# Patient Record
Sex: Female | Born: 1955
Health system: Southern US, Community
[De-identification: ages and names within clinical notes are randomized; demographics above are authoritative.]

## PROBLEM LIST (undated history)

## (undated) DIAGNOSIS — J309 Allergic rhinitis, unspecified: Secondary | ICD-10-CM

## (undated) DIAGNOSIS — M1990 Unspecified osteoarthritis, unspecified site: Secondary | ICD-10-CM

## (undated) DIAGNOSIS — T7840XA Allergy, unspecified, initial encounter: Secondary | ICD-10-CM

## (undated) DIAGNOSIS — N189 Chronic kidney disease, unspecified: Secondary | ICD-10-CM

## (undated) DIAGNOSIS — I10 Essential (primary) hypertension: Secondary | ICD-10-CM

## (undated) HISTORY — DX: Allergic rhinitis, unspecified: J30.9

## (undated) HISTORY — DX: Unspecified osteoarthritis, unspecified site: M19.90

## (undated) HISTORY — DX: Chronic kidney disease, unspecified: N18.9

## (undated) HISTORY — PX: ABDOMINAL HYSTERECTOMY: SHX81

## (undated) HISTORY — PX: CHOLECYSTECTOMY: SHX55

## (undated) HISTORY — DX: Allergy, unspecified, initial encounter: T78.40XA

## (undated) HISTORY — PX: APPENDECTOMY: SHX54

---

## 2005-07-11 ENCOUNTER — Emergency Department (HOSPITAL_COMMUNITY): Admission: EM | Admit: 2005-07-11 | Discharge: 2005-07-11 | Payer: Self-pay | Admitting: Family Medicine

## 2005-10-29 ENCOUNTER — Emergency Department (HOSPITAL_COMMUNITY): Admission: EM | Admit: 2005-10-29 | Discharge: 2005-10-29 | Payer: Self-pay | Admitting: Family Medicine

## 2005-11-05 ENCOUNTER — Emergency Department (HOSPITAL_COMMUNITY): Admission: EM | Admit: 2005-11-05 | Discharge: 2005-11-05 | Payer: Self-pay | Admitting: Family Medicine

## 2005-11-16 ENCOUNTER — Emergency Department (HOSPITAL_COMMUNITY): Admission: AD | Admit: 2005-11-16 | Discharge: 2005-11-16 | Payer: Self-pay | Admitting: Family Medicine

## 2007-09-08 ENCOUNTER — Emergency Department (HOSPITAL_COMMUNITY): Admission: EM | Admit: 2007-09-08 | Discharge: 2007-09-08 | Payer: Self-pay | Admitting: Family Medicine

## 2009-04-24 ENCOUNTER — Emergency Department (HOSPITAL_COMMUNITY): Admission: EM | Admit: 2009-04-24 | Discharge: 2009-04-24 | Payer: Self-pay | Admitting: Family Medicine

## 2010-02-15 ENCOUNTER — Emergency Department (HOSPITAL_COMMUNITY): Admission: EM | Admit: 2010-02-15 | Discharge: 2010-02-15 | Payer: Self-pay | Admitting: Family Medicine

## 2010-10-03 ENCOUNTER — Inpatient Hospital Stay (INDEPENDENT_AMBULATORY_CARE_PROVIDER_SITE_OTHER)
Admission: RE | Admit: 2010-10-03 | Discharge: 2010-10-03 | Disposition: A | Discharge status: Home / Self Care | Payer: 59 | Source: Ambulatory Visit | Attending: Family Medicine | Admitting: Family Medicine

## 2010-10-03 DIAGNOSIS — B353 Tinea pedis: Secondary | ICD-10-CM

## 2011-07-06 ENCOUNTER — Other Ambulatory Visit (HOSPITAL_COMMUNITY)
Admission: RE | Admit: 2011-07-06 | Discharge: 2011-07-06 | Disposition: A | Discharge status: Home / Self Care | Payer: 59 | Source: Ambulatory Visit | Attending: Internal Medicine | Admitting: Internal Medicine

## 2011-07-06 ENCOUNTER — Other Ambulatory Visit (INDEPENDENT_AMBULATORY_CARE_PROVIDER_SITE_OTHER): Payer: 59

## 2011-07-06 ENCOUNTER — Ambulatory Visit (INDEPENDENT_AMBULATORY_CARE_PROVIDER_SITE_OTHER): Payer: 59 | Admitting: Internal Medicine

## 2011-07-06 ENCOUNTER — Encounter: Payer: Self-pay | Admitting: Internal Medicine

## 2011-07-06 VITALS — BP 124/84 | HR 80 | Temp 97.3°F | Resp 16 | Ht 64.0 in | Wt 131.0 lb

## 2011-07-06 DIAGNOSIS — Z0000 Encounter for general adult medical examination without abnormal findings: Secondary | ICD-10-CM

## 2011-07-06 DIAGNOSIS — F17200 Nicotine dependence, unspecified, uncomplicated: Secondary | ICD-10-CM

## 2011-07-06 DIAGNOSIS — Z1231 Encounter for screening mammogram for malignant neoplasm of breast: Secondary | ICD-10-CM

## 2011-07-06 DIAGNOSIS — Z124 Encounter for screening for malignant neoplasm of cervix: Secondary | ICD-10-CM

## 2011-07-06 DIAGNOSIS — Z01419 Encounter for gynecological examination (general) (routine) without abnormal findings: Secondary | ICD-10-CM | POA: Insufficient documentation

## 2011-07-06 DIAGNOSIS — Z720 Tobacco use: Secondary | ICD-10-CM | POA: Insufficient documentation

## 2011-07-06 DIAGNOSIS — J019 Acute sinusitis, unspecified: Secondary | ICD-10-CM

## 2011-07-06 DIAGNOSIS — Z23 Encounter for immunization: Secondary | ICD-10-CM

## 2011-07-06 DIAGNOSIS — Z800 Family history of malignant neoplasm of digestive organs: Secondary | ICD-10-CM | POA: Insufficient documentation

## 2011-07-06 LAB — URINALYSIS, ROUTINE W REFLEX MICROSCOPIC
Nitrite: NEGATIVE
Total Protein, Urine: NEGATIVE
Urine Glucose: NEGATIVE
pH: 6 (ref 5.0–8.0)

## 2011-07-06 LAB — COMPREHENSIVE METABOLIC PANEL
Albumin: 3.8 g/dL (ref 3.5–5.2)
Alkaline Phosphatase: 83 U/L (ref 39–117)
BUN: 18 mg/dL (ref 6–23)
CO2: 27 mEq/L (ref 19–32)
GFR: 74.18 mL/min (ref >60.00)
Glucose, Bld: 92 mg/dL (ref 70–99)
Total Bilirubin: 0.2 mg/dL — ABNORMAL LOW (ref 0.3–1.2)

## 2011-07-06 LAB — LIPID PANEL
Cholesterol: 185 mg/dL (ref 0–200)
LDL Cholesterol: 108 mg/dL — ABNORMAL HIGH (ref 0–99)
Triglycerides: 178 mg/dL — ABNORMAL HIGH (ref 0.0–149.0)
VLDL: 35.6 mg/dL (ref 0.0–40.0)

## 2011-07-06 LAB — CBC WITH DIFFERENTIAL/PLATELET
Basophils Relative: 0.7 % (ref 0.0–3.0)
Eosinophils Relative: 5.2 % — ABNORMAL HIGH (ref 0.0–5.0)
HCT: 37.6 % (ref 36.0–46.0)
Hemoglobin: 12.1 g/dL (ref 12.0–15.0)
Lymphs Abs: 2.7 10*3/uL (ref 0.7–4.0)
MCV: 79 fl (ref 78.0–100.0)
Monocytes Absolute: 0.5 10*3/uL (ref 0.1–1.0)
Monocytes Relative: 6.5 % (ref 3.0–12.0)
RBC: 4.76 Mil/uL (ref 3.87–5.11)
WBC: 6.9 10*3/uL (ref 4.5–10.5)

## 2011-07-06 MED ORDER — VARENICLINE TARTRATE 1 MG PO TABS: 1.0000 mg | ORAL_TABLET | Freq: Two times a day (BID) | ORAL | Status: DC

## 2011-07-06 MED ORDER — VARENICLINE TARTRATE 0.5 MG X 11 & 1 MG X 42 PO MISC: ORAL | Status: DC

## 2011-07-06 MED ORDER — CEFUROXIME AXETIL 500 MG PO TABS: 500.0000 mg | ORAL_TABLET | Freq: Two times a day (BID) | ORAL | Status: AC

## 2011-07-06 NOTE — Assessment & Plan Note (Signed)
Start ceftin for the infection 

## 2011-07-06 NOTE — Assessment & Plan Note (Signed)
Exam done, labs ordered, vaccines updated, referrals issued, pt ed material was given

## 2011-07-06 NOTE — Assessment & Plan Note (Signed)
She agrees to quit smoking and has decided to use Chantix

## 2011-07-06 NOTE — Assessment & Plan Note (Signed)
She was referred for a colonoscopy

## 2011-07-06 NOTE — Progress Notes (Signed)
Subjective:    Patient ID: Nichole Hanson, female    DOB: 19-Jun-1955, 56 y.o.   MRN: [id number]  Sinusitis This is a new problem. The current episode started in the past 7 days. The problem has been gradually worsening since onset. There has been no fever. Her pain is at a severity of 0/10. She is experiencing no pain. Associated symptoms include congestion, sinus pressure, sneezing and a sore throat. Pertinent negatives include no chills, coughing, diaphoresis, ear pain, headaches, hoarse voice, neck pain, shortness of breath or swollen glands. Past treatments include nothing.      Review of Systems  Constitutional: Negative for fever, chills, diaphoresis, activity change, appetite change, fatigue and unexpected weight change.  HENT: Positive for congestion, sore throat, rhinorrhea, sneezing, postnasal drip and sinus pressure. Negative for hearing loss, ear pain, nosebleeds, hoarse voice, facial swelling, drooling, mouth sores, trouble swallowing, neck pain, neck stiffness, dental problem, voice change, tinnitus and ear discharge.   Eyes: Negative.   Respiratory: Negative for apnea, cough, choking, chest tightness, shortness of breath, wheezing and stridor.   Cardiovascular: Negative for chest pain, palpitations and leg swelling.  Gastrointestinal: Negative for nausea, vomiting, abdominal pain, diarrhea, constipation, blood in stool, abdominal distention, anal bleeding and rectal pain.  Genitourinary: Negative for dysuria, urgency, frequency, hematuria, flank pain, decreased urine volume, vaginal bleeding, vaginal discharge, enuresis, difficulty urinating, genital sores, vaginal pain, menstrual problem, pelvic pain and dyspareunia.  Musculoskeletal: Positive for arthralgias (knees and shoulders). Negative for myalgias, back pain, joint swelling and gait problem.  Skin: Negative for color change, pallor, rash and wound.  Neurological: Negative for dizziness, tremors, seizures, syncope, facial  asymmetry, speech difficulty, weakness, light-headedness, numbness and headaches.  Hematological: Negative for adenopathy. Does not bruise/bleed easily.  Psychiatric/Behavioral: Negative.        Objective:   Physical Exam  Vitals reviewed. Constitutional: She is oriented to person, place, and time. She appears well-developed and well-nourished. No distress.  HENT:  Head: No trismus in the jaw.  Right Ear: Hearing, tympanic membrane, external ear and ear canal normal.  Left Ear: Hearing, external ear and ear canal normal.  Nose: Mucosal edema and rhinorrhea present. No nose lacerations, sinus tenderness, nasal deformity, septal deviation or nasal septal hematoma. No epistaxis.  No foreign bodies. Right sinus exhibits maxillary sinus tenderness. Right sinus exhibits no frontal sinus tenderness. Left sinus exhibits maxillary sinus tenderness. Left sinus exhibits no frontal sinus tenderness.  Mouth/Throat: Uvula is midline, oropharynx is clear and moist and mucous membranes are normal. Mucous membranes are not pale, not dry and not cyanotic. No uvula swelling. No oropharyngeal exudate, posterior oropharyngeal edema, posterior oropharyngeal erythema or tonsillar abscesses.  Eyes: Conjunctivae are normal. Right eye exhibits no discharge. Left eye exhibits no discharge. No scleral icterus.  Neck: Normal range of motion. Neck supple. No JVD present. No tracheal deviation present. No thyromegaly present.  Cardiovascular: Normal rate, regular rhythm, normal heart sounds and intact distal pulses.  Exam reveals no gallop and no friction rub.   No murmur heard. Pulmonary/Chest: Breath sounds normal. No accessory muscle usage or stridor. Not tachypneic. No respiratory distress. She has no decreased breath sounds. She has no wheezes. She has no rhonchi. She has no rales. Chest wall is not dull to percussion. She exhibits no mass, no tenderness, no bony tenderness, no laceration, no crepitus, no edema, no  deformity, no swelling and no retraction. Right breast exhibits no inverted nipple, no mass, no nipple discharge, no skin change and no tenderness. Left  breast exhibits no inverted nipple, no mass, no nipple discharge, no skin change and no tenderness. Breasts are symmetrical.  Abdominal: Soft. Bowel sounds are normal. She exhibits no distension and no mass. There is no tenderness. There is no rebound and no guarding. Hernia confirmed negative in the right inguinal area and confirmed negative in the left inguinal area.  Genitourinary: Vagina normal. No breast swelling, tenderness, discharge or bleeding. No labial fusion. There is no rash, tenderness, lesion or injury on the right labia. There is no rash, tenderness, lesion or injury on the left labia. No erythema, tenderness or bleeding around the vagina. No foreign body around the vagina. No signs of injury around the vagina. No vaginal discharge found.  Musculoskeletal: Normal range of motion. She exhibits no edema and no tenderness.  Lymphadenopathy:    She has no cervical adenopathy.       Right: No inguinal adenopathy present.       Left: No inguinal adenopathy present.  Neurological: She is oriented to person, place, and time.  Skin: Skin is warm and dry. No rash noted. She is not diaphoretic. No erythema. No pallor.  Psychiatric: She has a normal mood and affect. Her behavior is normal. Judgment and thought content normal.          Assessment & Plan:

## 2011-07-06 NOTE — Assessment & Plan Note (Signed)
ordered

## 2011-07-06 NOTE — Patient Instructions (Signed)
Preventative Care for Adults, Female A healthy lifestyle and preventative care can promote health and wellness. Preventative health guidelines for women include the following key practices:  A routine yearly physical is a good way to check with your caregiver about your health and preventative screening. It is a chance to share any concerns and updates on your health, and to receive a thorough exam.   Visit your dentist for a routine exam and preventative care every 6 months. Brush your teeth twice a day and floss once a day. Good oral hygiene prevents tooth decay and gum disease.   The frequency of eye exams is based on your age, health, family medical history, use of contact lenses, and other factors. Follow your caregiver's recommendations for frequency of eye exams.   Eat a healthy diet. Foods like vegetables, fruits, whole grains, low-fat dairy products, and lean protein foods contain the nutrients you need without too many calories. Decrease your intake of foods high in solid fats, added sugars, and salt. Eat the right amount of calories for you.Get information about a proper diet from your caregiver, if necessary.   Regular physical exercise is one of the most important things you can do for your health. Most adults should get at least 150 minutes of moderate-intensity exercise (any activity that increases your heart rate and causes you to sweat) each week. In addition, most adults need muscle-strengthening exercises on 2 or more days a week.   Maintain a healthy weight. The body mass index (BMI) is a screening tool to identify possible weight problems. It provides an estimate of body fat based on height and weight. Your caregiver can help determine your BMI, and can help you achieve or maintain a healthy weight.For adults 20 years and older:   A BMI below 18.5 is considered underweight.   A BMI of 18.5 to 24.9 is normal.   A BMI of 25 to 29.9 is considered overweight.   A BMI of 30 and  above is considered obese.   Maintain normal blood lipids and cholesterol levels by exercising and minimizing your intake of saturated fat. Eat a balanced diet with plenty of fruit and vegetables. Blood tests for lipids and cholesterol should begin at age 20 and be repeated every 5 years. If your lipid or cholesterol levels are high, you are over 50, or you are a high risk for heart disease, you may need your cholesterol levels checked more frequently.Ongoing high lipid and cholesterol levels should be treated with medicines if diet and exercise are not effective.   If you smoke, find out from your caregiver how to quit. If you do not use tobacco, do not start.   If you are pregnant, do not drink alcohol. If you are breastfeeding, be very cautious about drinking alcohol. If you are not pregnant and choose to drink alcohol, do not exceed 1 drink per day. One drink is considered to be 12 ounces (355 mL) of beer, 5 ounces (148 mL) of wine, or 1.5 ounces (44 mL) of liquor.   Avoid use of street drugs. Do not share needles with anyone. Ask for help if you need support or instructions about stopping the use of drugs.   High blood pressure causes heart disease and increases the risk of stroke. Your blood pressure should be checked at least every 1 to 2 years. Ongoing high blood pressure should be treated with medicines if weight loss and exercise are not effective.   If you are 55 to 58   years old, ask your caregiver if you should take aspirin to prevent strokes.   Diabetes screening involves taking a blood sample to check your fasting blood sugar level. This should be done once every 3 years, after age 45, if you are within normal weight and without risk factors for diabetes. Testing should be considered at a younger age or be carried out more frequently if you are overweight and have at least 1 risk factor for diabetes.   Breast cancer screening is essential preventative care for women. You should  practice "breast self-awareness." This means understanding the normal appearance and feel of your breasts and may include breast self-examination. Any changes detected, no matter how small, should be reported to a caregiver. Women in their 20s and 30s should have a clinical breast exam (CBE) by a caregiver as part of a regular health exam every 1 to 3 years. After age 40, women should have a CBE every year. Starting at age 40, women should consider having a mammogram (breast X-ray) every year. Women who have a family history of breast cancer should talk to their caregiver about genetic screening. Women at a high risk of breast cancer should talk to their caregiver about having an MRI and a mammogram every year.   The Pap test is a screening test for cervical cancer. A Pap test can show cell changes on the cervix that might become cervical cancer if left untreated. A Pap test is a procedure in which cells are obtained and examined from the lower end of the uterus (cervix).   Women should have a Pap test starting at age 21.   Between ages 21 and 29, Pap tests should be repeated every 2 years.   Beginning at age 30, you should have a Pap test every 3 years as long as the past 3 Pap tests have been normal.   Some women have medical problems that increase the chance of getting cervical cancer. Talk to your caregiver about these problems. It is especially important to talk to your caregiver if a new problem develops soon after your last Pap test. In these cases, your caregiver may recommend more frequent screening and Pap tests.   The above recommendations are the same for women who have or have not gotten the vaccine for human papillomavirus (HPV).   If you had a hysterectomy for a problem that was not cancer or a condition that could lead to cancer, then you no longer need Pap tests. Even if you no longer need a Pap test, a regular exam is a good idea to make sure no other problems are starting.   If you  are between ages 65 and 70, and you have had normal Pap tests going back 10 years, you no longer need Pap tests. Even if you no longer need a Pap test, a regular exam is a good idea to make sure no other problems are starting.   If you have had past treatment for cervical cancer or a condition that could lead to cancer, you need Pap tests and screening for cancer for at least 20 years after your treatment.   If Pap tests have been discontinued, risk factors (such as a new sexual partner) need to be reassessed to determine if screening should be resumed.   The HPV test is an additional test that may be used for cervical cancer screening. The HPV test looks for the virus that can cause the cell changes on the cervix. The cells collected   during the Pap test can be tested for HPV. The HPV test could be used to screen women aged 30 years and older, and should be used in women of any age who have unclear Pap test results. After the age of 30, women should have HPV testing at the same frequency as a Pap test.   Colorectal cancer can be detected and often prevented. Most routine colorectal cancer screening begins at the age of 50 and continues through age 75. However, your caregiver may recommend screening at an earlier age if you have risk factors for colon cancer. On a yearly basis, your caregiver may provide home test kits to check for hidden blood in the stool. Use of a small camera at the end of a tube, to directly examine the colon (sigmoidoscopy or colonoscopy), can detect the earliest forms of colorectal cancer. Talk to your caregiver about this at age 50, when routine screening begins. Direct examination of the colon should be repeated every 5 to 10 years through age 75, unless early forms of pre-cancerous polyps or small growths are found.   Practice safe sex. Use condoms and avoid high-risk sexual practices to reduce the spread of sexually transmitted infections (STIs). STIs include gonorrhea,  chlamydia, syphilis, trichomonas, herpes, HPV, and human immunodeficiency virus (HIV). Herpes, HIV, and HPV are viral illnesses that have no cure. They can result in disability, cancer, and death. Sexually active women aged 25 and younger should be checked for Chlamydia. Older women with new or multiple partners should also be tested for Chlamydia. Testing for other STIs is recommended if you are sexually active and at increased risk.   Osteoporosis is a disease in which the bones lose minerals and strength with aging. This can result in serious bone fractures. The risk of osteoporosis can be identified using a bone density scan. Women ages 65 and over and women at risk for fractures or osteoporosis should discuss screening with their caregivers. Ask your caregiver whether you should take a calcium supplement or vitamin D to reduce the rate of osteoporosis.   Menopause can be associated with physical symptoms and risks. Hormone replacement therapy is available to decrease symptoms and risks. You should talk to your caregiver about whether hormone replacement therapy is right for you.   Use sunscreen with skin protection factor (SPF) of 30 or more. Apply sunscreen liberally and repeatedly throughout the day. You should seek shade when your shadow is shorter than you. Protect yourself by wearing long sleeves, pants, a wide-brimmed hat, and sunglasses year round, whenever you are outdoors.   Once a month, do a whole body skin exam, using a mirror to look at the skin on your back. Notify your caregiver of new moles, moles that have irregular borders, moles that are larger than a pencil eraser, or moles that have changed in shape or color.   Stay current with required immunizations.   Influenza. You need a dose every fall (or winter). The composition of the flu vaccine changes each year, so being vaccinated once is not enough.   Pneumococcal polysaccharide. You need 1 to 2 doses if you smoke cigarettes or  if you have certain chronic medical conditions. You need 1 dose at age 65 (or older) if you have never been vaccinated.   Tetanus, diphtheria, pertussis (Tdap, Td). Get 1 dose of Tdap vaccine if you are younger than age 65 years, are over 65 and have contact with an infant, are a healthcare worker, are pregnant, or simply want   to be protected from whooping cough. After that, you need a Td booster dose every 10 years. Consult your caregiver if you have not had at least 3 tetanus and diphtheria-containing shots sometime in your life or have a deep or dirty wound.   HPV. You need this vaccine if you are a woman age 26 years or younger. The vaccine is given in 3 doses over 6 months.   Measles, mumps, rubella (MMR). You need at least 1 dose of MMR if you were born in 1957 or later. You may also need a 2nd dose.   Meningococcal. If you are age 19 to 21 years and a first-year college student living in a residence hall, or have one of several medical conditions, you need to get vaccinated against meningococcal disease. You may also need additional booster doses.   Zoster (shingles). If you are age 60 years or older, you should get this vaccine.   Varicella (chickenpox). If you have never had chickenpox or you were vaccinated but received only 1 dose, talk to your caregiver to find out if you need this vaccine.   Hepatitis A. You need this vaccine if you have a specific risk factor for hepatitis A virus infection or you simply wish to be protected from this disease. The vaccine is usually given as 2 doses, 6 to 18 months apart.   Hepatitis B. You need this vaccine if you have a specific risk factor for hepatitis B virus infection or you simply wish to be protected from this disease. The vaccine is given in 3 doses, usually over 6 months.  Preventative Services / Frequency Ages 19 to 39  Blood pressure check.** / Every 1 to 2 years.   Lipid and cholesterol check.**/ Every 5 years beginning at age 20.    Clinical breast exam.** / Every 3 years for women in their 20s and 30s.   Pap Test.** / Every 2 years from ages 21 through 29. Every 3 years starting at age 30 years through age 65 or 70 with a history of 3 consecutive normal Pap tests.   HPV Screening.** / Every 3 years from ages 30 through ages 65 to 70 with a history of 3 consecutive normal Pap tests.   Skin self-exam. / Monthly.   Influenza immunization.** / Every year.   Pneumococcal polysaccharide immunization.** / 1 to 2 doses if you smoke cigarettes or if you have certain chronic medical conditions.   Tetanus, diphtheria, pertussis (Tdap,Td) immunization. / A one-time dose of Tdap vaccine. After that, you need a Td booster dose every 10 years.   HPV immunization. / 3 doses over 6 months, if 26 and younger.   Measles, mumps, rubella (MMR) immunization. / You need at least 1 dose of MMR if you were born in 1957 or later. You may also need a 2nd dose.   Meningococcal immunization. / 1 dose if you are age 19 to 21 years and a first-year college student living in a residence hall, or have one of several medical conditions, you need to get vaccinated against meningococcal disease. You may also need additional booster doses.   Varicella immunization. **/ Consult your caregiver.   Hepatitis A immunization. ** / Consult your caregiver. 2 doses, 6 to 18 months apart.   Hepatitis B immunization.** / Consult your caregiver. 3 doses usually over 6 months.  Ages 40 to 64  Blood pressure check.** / Every 1 to 2 years.   Lipid and cholesterol check.**/ Every 5 years beginning   at age 20.   Clinical breast exam.** / Every year after age 40.   Mammogram.** / Every year beginning at age 40 and continuing for as long as you are in good health. Consult with your caregiver.   Pap Test.** / Every 3 years starting at age 30 years through age 65 or 70 with a history of 3 consecutive normal Pap tests.   HPV Screening.** / Every 3 years from  ages 30 through ages 65 to 70 with a history of 3 consecutive normal Pap tests.   Fecal occult blood test (FOBT) of stool. / Every year beginning at age 50 and continuing until age 75. You may not have to do this test if you get colonoscopy every 10 years.   Flexible sigmoidoscopy** or colonoscopy.** / Every 5 years for a flexible sigmoidoscopy or every 10 years for a colonoscopy beginning at age 50 and continuing until age 75.   Skin self-exam. / Monthly.   Influenza immunization.** / Every year.   Pneumococcal polysaccharide immunization.** / 1 to 2 doses if you smoke cigarettes or if you have certain chronic medical conditions.   Tetanus, diphtheria, pertussis (Tdap/Td) immunization.** / A one-time dose of Tdap vaccine. After that, you need a Td booster dose every 10 years.   Measles, mumps, rubella (MMR) immunization. / You need at least 1 dose of MMR if you were born in 1957 or later. You may also need a 2nd dose.   Varicella immunization. **/ Consult your caregiver.   Meningococcal immunization.** / Consult your caregiver.     Hepatitis A immunization. ** / Consult your caregiver. 2 doses, 6 to 18 months apart.   Hepatitis B immunization.** / Consult your caregiver. 3 doses, usually over 6 months.  Ages 65 and over  Blood pressure check.** / Every 1 to 2 years.   Lipid and cholesterol check.**/ Every 5 years beginning at age 20.   Clinical breast exam.** / Every year after age 40.   Mammogram.** / Every year beginning at age 40 and continuing for as long as you are in good health. Consult with your caregiver.   Pap Test,** / Every 3 years starting at age 30 years through age 65 or 70 with a 3 consecutive normal Pap tests. Testing can be stopped between 65 and 70 with 3 consecutive normal Pap tests and no abnormal Pap or HPV tests in the past 10 years.   HPV Screening.** / Every 3 years from ages 30 through ages 65 or 70 with a history of 3 consecutive normal Pap tests.  Testing can be stopped between 65 and 70 with 3 consecutive normal Pap tests and no abnormal Pap or HPV tests in the past 10 years.   Fecal occult blood test (FOBT) of stool. / Every year beginning at age 50 and continuing until age 75. You may not have to do this test if you get colonoscopy every 10 years.   Flexible sigmoidoscopy** or colonoscopy.** / Every 5 years for a flexible sigmoidoscopy or every 10 years for a colonoscopy beginning at age 50 and continuing until age 75.   Osteoporosis screening.** / A one-time screening for women ages 65 and over and women at risk for fractures or osteoporosis.   Skin self-exam. / Monthly.   Influenza immunization.** / Every year.   Pneumococcal polysaccharide immunization.** / 1 dose at age 65 (or older) if you have never been vaccinated.   Tetanus, diphtheria, pertussis (Tdap, Td) immunization. / A one-time dose of Tdap   vaccine if you are over 65 and have contact with an infant, are a healthcare worker, or simply want to be protected from whooping cough. After that, you need a Td booster dose every 10 years.   Varicella immunization. **/ Consult your caregiver.   Meningococcal immunization.** / Consult your caregiver.   Hepatitis A immunization. ** / Consult your caregiver. 2 doses, 6 to 18 months apart.   Hepatitis B immunization.** / Check with your caregiver. 3 doses, usually over 6 months.  ** Family history and personal history of risk and conditions may change your caregiver's recommendations. Document Released: 06/20/2001 Document Revised: 01/04/2011 Document Reviewed: 09/19/2010 [employer] Patient Information 2012 [employer]  Smoking Cessation This document explains the best ways for you to quit smoking and new treatments to help. It lists new medicines that can double or triple your chances of quitting and quitting for good. It also considers ways to avoid relapses and concerns you may have about quitting, including weight  gain. NICOTINE: A POWERFUL ADDICTION If you have tried to quit smoking, you know how hard it can be. It is hard because nicotine is a very addictive drug. For some people, it can be as addictive as heroin or cocaine. Usually, people make 2 or 3 tries, or more, before finally being able to quit. Each time you try to quit, you can learn about what helps and what hurts. Quitting takes hard work and a lot of effort, but you can quit smoking. QUITTING SMOKING IS ONE OF THE MOST IMPORTANT THINGS YOU WILL EVER DO.  You will live longer, feel better, and live better.   The impact on your body of quitting smoking is felt almost immediately:   Within 20 minutes, blood pressure decreases. Pulse returns to its normal level.   After 8 hours, carbon monoxide levels in the blood return to normal. Oxygen level increases.   After 24 hours, chance of heart attack starts to decrease. Breath, hair, and body stop smelling like smoke.   After 48 hours, damaged nerve endings begin to recover. Sense of taste and smell improve.   After 72 hours, the body is virtually free of nicotine. Bronchial tubes relax and breathing becomes easier.   After 2 to 12 weeks, lungs can hold more air. Exercise becomes easier and circulation improves.   Quitting will reduce your risk of having a heart attack, stroke, cancer, or lung disease:   After 1 year, the risk of coronary heart disease is cut in half.   After 5 years, the risk of stroke falls to the same as a nonsmoker.   After 10 years, the risk of lung cancer is cut in half and the risk of other cancers decreases significantly.   After 15 years, the risk of coronary heart disease drops, usually to the level of a nonsmoker.   If you are pregnant, quitting smoking will improve your chances of having a healthy baby.   The people you live with, especially your children, will be healthier.   You will have extra money to spend on things other than cigarettes.  FIVE KEYS TO  QUITTING Studies have shown that these 5 steps will help you quit smoking and quit for good. You have the best chances of quitting if you use them together: 1. Get ready.  2. Get support and encouragement.  3. Learn new skills and behaviors.  4. Get medicine to reduce your nicotine addiction and use it correctly.  5. Be prepared for relapse or difficult   situations. Be determined to continue trying to quit, even if you do not succeed at first.  1. GET READY  Set a quit date.   Change your environment.   Get rid of ALL cigarettes, ashtrays, matches, and lighters in your home, car, and place of work.   Do not let people smoke in your home.   Review your past attempts to quit. Think about what worked and what did not.   Once you quit, do not smoke. NOT EVEN A PUFF!  2. GET SUPPORT AND ENCOURAGEMENT Studies have shown that you have a better chance of being successful if you have help. You can get support in many ways.  Tell your family, friends, and coworkers that you are going to quit and need their support. Ask them not to smoke around you.   Talk to your caregivers (doctor, dentist, nurse, pharmacist, psychologist, and/or smoking counselor).   Get individual, group, or telephone counseling and support. The more counseling you have, the better your chances are of quitting. Programs are available at local hospitals and health centers. Call your local health department for information about programs in your area.   Spiritual beliefs and practices may help some smokers quit.   Quit meters are small computer programs online or downloadable that keep track of quit statistics, such as amount of "quit-time," cigarettes not smoked, and money saved.   Many smokers find one or more of the many self-help books available useful in helping them quit and stay off tobacco.  3. LEARN NEW SKILLS AND BEHAVIORS  Try to distract yourself from urges to smoke. Talk to someone, go for a walk, or occupy  your time with a task.   When you first try to quit, change your routine. Take a different route to work. Drink tea instead of coffee. Eat breakfast in a different place.   Do something to reduce your stress. Take a hot bath, exercise, or read a book.   Plan something enjoyable to do every day. Reward yourself for not smoking.   Explore interactive web-based programs that specialize in helping you quit.  4. GET MEDICINE AND USE IT CORRECTLY Medicines can help you stop smoking and decrease the urge to smoke. Combining medicine with the above behavioral methods and support can quadruple your chances of successfully quitting smoking. The [place] Food and Drug Administration (FDA) has approved 7 medicines to help you quit smoking. These medicines fall into 3 categories.  Nicotine replacement therapy (delivers nicotine to your body without the negative effects and risks of smoking):   Nicotine gum: Available over-the-counter.   Nicotine lozenges: Available over-the-counter.   Nicotine inhaler: Available by prescription.   Nicotine nasal spray: Available by prescription.   Nicotine skin patches (transdermal): Available by prescription and over-the-counter.   Antidepressant medicine (helps people abstain from smoking, but how this works is unknown):   Bupropion sustained-release (SR) tablets: Available by prescription.   Nicotinic receptor partial agonist (simulates the effect of nicotine in your brain):   Varenicline tartrate tablets: Available by prescription.   Ask your caregiver for advice about which medicines to use and how to use them. Carefully read the information on the package.   Everyone who is trying to quit may benefit from using a medicine. If you are pregnant or trying to become pregnant, nursing an infant, you are under age 18, or you smoke fewer than 10 cigarettes per day, talk to your caregiver before taking any nicotine replacement medicines.   You should stop   using a  nicotine replacement product and call your caregiver if you experience nausea, dizziness, weakness, vomiting, fast or irregular heartbeat, mouth problems with the lozenge or gum, or redness or swelling of the skin around the patch that does not go away.   Do not use any other product containing nicotine while using a nicotine replacement product.   Talk to your caregiver before using these products if you have diabetes, heart disease, asthma, stomach ulcers, you had a recent heart attack, you have high blood pressure that is not controlled with medicine, a history of irregular heartbeat, or you have been prescribed medicine to help you quit smoking.  5. BE PREPARED FOR RELAPSE OR DIFFICULT SITUATIONS  Most relapses occur within the first 3 months after quitting. Do not be discouraged if you start smoking again. Remember, most people try several times before they finally quit.   You may have symptoms of withdrawal because your body is used to nicotine. You may crave cigarettes, be irritable, feel very hungry, cough often, get headaches, or have difficulty concentrating.   The withdrawal symptoms are only temporary. They are strongest when you first quit, but they will go away within 10 to 14 days.  Here are some difficult situations to watch for:  Alcohol. Avoid drinking alcohol. Drinking lowers your chances of successfully quitting.   Caffeine. Try to reduce the amount of caffeine you consume. It also lowers your chances of successfully quitting.   Other smokers. Being around smoking can make you want to smoke. Avoid smokers.   Weight gain. Many smokers will gain weight when they quit, usually less than 10 pounds. Eat a healthy diet and stay active. Do not let weight gain distract you from your main goal, quitting smoking. Some medicines that help you quit smoking may also help delay weight gain. You can always lose the weight gained after you quit.   Bad mood or depression. There are a lot of  ways to improve your mood other than smoking.  If you are having problems with any of these situations, talk to your caregiver. SPECIAL SITUATIONS AND CONDITIONS Studies suggest that everyone can quit smoking. Your situation or condition can give you a special reason to quit.  Pregnant women/new mothers: By quitting, you protect your baby's health and your own.   Hospitalized patients: By quitting, you reduce health problems and help healing.   Heart attack patients: By quitting, you reduce your risk of a second heart attack.   Lung, head, and neck cancer patients: By quitting, you reduce your chance of a second cancer.   Parents of children and adolescents: By quitting, you protect your children from illnesses caused by secondhand smoke.  QUESTIONS TO THINK ABOUT Think about the following questions before you try to stop smoking. You may want to talk about your answers with your caregiver.  Why do you want to quit?   If you tried to quit in the past, what helped and what did not?   What will be the most difficult situations for you after you quit? How will you plan to handle them?   Who can help you through the tough times? Your family? Friends? Caregiver?   What pleasures do you get from smoking? What ways can you still get pleasure if you quit?  Here are some questions to ask your caregiver:  How can you help me to be successful at quitting?   What medicine do you think would be best for me and how should   I take it?   What should I do if I need more help?   What is smoking withdrawal like? How can I get information on withdrawal?  Quitting takes hard work and a lot of effort, but you can quit smoking. FOR MORE INFORMATION  Smokefree.gov (http://www.smokefree.gov) provides free, accurate, evidence-based information and professional assistance to help support the immediate and long-term needs of people trying to quit smoking. Document Released: 04/18/2001 Document Revised:  01/04/2011 Document Reviewed: 02/08/2009 ExitCare Patient Information 2012 [employer]. 

## 2011-07-07 ENCOUNTER — Encounter: Payer: Self-pay | Admitting: Internal Medicine

## 2011-07-07 ENCOUNTER — Other Ambulatory Visit: Payer: Self-pay | Admitting: Internal Medicine

## 2011-07-07 DIAGNOSIS — R319 Hematuria, unspecified: Secondary | ICD-10-CM | POA: Insufficient documentation

## 2011-07-11 ENCOUNTER — Encounter: Payer: Self-pay | Admitting: Gastroenterology

## 2011-07-24 ENCOUNTER — Encounter: Payer: Self-pay | Admitting: Internal Medicine

## 2011-07-24 ENCOUNTER — Ambulatory Visit (INDEPENDENT_AMBULATORY_CARE_PROVIDER_SITE_OTHER): Payer: 59 | Admitting: Internal Medicine

## 2011-07-24 ENCOUNTER — Other Ambulatory Visit (INDEPENDENT_AMBULATORY_CARE_PROVIDER_SITE_OTHER): Payer: 59

## 2011-07-24 DIAGNOSIS — J309 Allergic rhinitis, unspecified: Secondary | ICD-10-CM

## 2011-07-24 DIAGNOSIS — R319 Hematuria, unspecified: Secondary | ICD-10-CM

## 2011-07-24 DIAGNOSIS — F17200 Nicotine dependence, unspecified, uncomplicated: Secondary | ICD-10-CM

## 2011-07-24 DIAGNOSIS — Z720 Tobacco use: Secondary | ICD-10-CM

## 2011-07-24 HISTORY — DX: Allergic rhinitis, unspecified: J30.9

## 2011-07-24 LAB — URINALYSIS, ROUTINE W REFLEX MICROSCOPIC
Bilirubin Urine: NEGATIVE
Ketones, ur: NEGATIVE
Total Protein, Urine: NEGATIVE
Urine Glucose: NEGATIVE
Urobilinogen, UA: 0.2 (ref 0.0–1.0)

## 2011-07-24 MED ORDER — AZELASTINE-FLUTICASONE 137-50 MCG/ACT NA SUSP: 1.0000 | Freq: Two times a day (BID) | NASAL | Status: DC

## 2011-07-24 NOTE — Patient Instructions (Signed)
Hematuria, Adult  Hematuria (blood in your urine) can be caused by a bladder infection (cystitis), kidney infection (pyelonephritis), prostate infection (prostatitis), or kidney stone. Infections will usually respond to antibiotics (medications which kill germs), and a kidney stone will usually pass through your urine without further treatment. If you were put on antibiotics, take all the medicine until gone. You may feel better in a few days, but take all of your medicine or the infection may not respond and become more difficult to treat. If antibiotics were not given, an infection did not cause the blood in the urine. A further work up to find out the reason may be needed.  HOME CARE INSTRUCTIONS   · Drink lots of fluid, 3 to 4 quarts a day. If you have been diagnosed with an infection, cranberry juice is especially recommended, in addition to large amounts of water.  · Avoid caffeine, tea, and carbonated beverages, because they tend to irritate the bladder.  · Avoid alcohol as it may irritate the prostate.  · Only take over-the-counter or prescription medicines for pain, discomfort, or fever as directed by your caregiver.  · If you have been diagnosed with a kidney stone follow your caregivers instructions regarding straining your urine to catch the stone.  TO PREVENT FURTHER INFECTIONS:  · Empty the bladder often. Avoid holding urine for long periods of time.  · After a bowel movement, women should cleanse front to back. Use each tissue only once.  · Empty the bladder before and after sexual intercourse if you are a female.  · Return to your caregiver if you develop back pain, fever, nausea (feeling sick to your stomach), vomiting, or your symptoms (problems) are not better in 3 days. Return sooner if you are getting worse.  If you have been requested to return for further testing make sure to keep your appointments. If an infection is not the cause of blood in your urine, X-rays may be required. Your caregiver  will discuss this with you.  SEEK IMMEDIATE MEDICAL CARE IF:   · You have a persistent fever over 102° F (38.9° C).  · You develop severe vomiting and are unable to keep the medication down.  · You develop severe back or abdominal pain despite taking your medications.  · You begin passing a large amount of blood or clots in your urine.  · You feel extremely weak or faint, or pass out.  MAKE SURE YOU:   · Understand these instructions.  · Will watch your condition.  · Will get help right away if you are not doing well or get worse.  Document Released: 04/24/2005 Document Revised: 04/13/2011 Document Reviewed: 12/12/2007  ExitCare® Patient Information ©2012 [employer].

## 2011-07-24 NOTE — Assessment & Plan Note (Signed)
She has no s/s so I will recheck her UA today and will check a ur clx as well, if the hematuria persists then I will send her to see a urologist

## 2011-07-24 NOTE — Progress Notes (Signed)
Subjective:    Patient ID: Nichole Hanson, female    DOB: 05-10-1955, 56 y.o.   MRN: [id number]  Hyperlipidemia This is a chronic problem. The current episode started more than 1 year ago. Recent lipid tests were reviewed and are variable. Exacerbating diseases include obesity. She has no history of chronic renal disease, diabetes, hypothyroidism, liver disease or nephrotic syndrome. Factors aggravating her hyperlipidemia include fatty foods. Pertinent negatives include no chest pain, focal sensory loss, focal weakness, leg pain, myalgias or shortness of breath. She is currently on no antihyperlipidemic treatment. The current treatment provides no improvement of lipids. Compliance problems include adherence to exercise and adherence to diet.       Review of Systems  Constitutional: Negative for fever, chills, diaphoresis, activity change, appetite change, fatigue and unexpected weight change.  HENT: Positive for congestion, rhinorrhea, sneezing and postnasal drip. Negative for hearing loss, ear pain, nosebleeds, sore throat, facial swelling, drooling, mouth sores, trouble swallowing, neck pain, neck stiffness, dental problem, voice change, sinus pressure, tinnitus and ear discharge.   Eyes: Negative.   Respiratory: Negative for cough, chest tightness, shortness of breath, wheezing and stridor.   Cardiovascular: Negative for chest pain, palpitations and leg swelling.  Gastrointestinal: Negative for nausea, vomiting, abdominal pain, diarrhea, constipation, blood in stool, abdominal distention, anal bleeding and rectal pain.  Genitourinary: Negative for dysuria, urgency, frequency, hematuria, flank pain, decreased urine volume, vaginal bleeding, vaginal discharge, enuresis, difficulty urinating, genital sores, vaginal pain, menstrual problem, pelvic pain and dyspareunia.  Musculoskeletal: Negative for myalgias, back pain, joint swelling, arthralgias and gait problem.  Skin: Negative for color change,  pallor, rash and wound.  Neurological: Negative.  Negative for focal weakness.  Hematological: Negative for adenopathy. Does not bruise/bleed easily.  Psychiatric/Behavioral: Negative.        Objective:   Physical Exam  Vitals reviewed. Constitutional: She is oriented to person, place, and time. She appears well-developed and well-nourished. No distress.  HENT:  Head: Normocephalic and atraumatic. No trismus in the jaw.  Right Ear: Hearing, tympanic membrane, external ear and ear canal normal.  Left Ear: Hearing, tympanic membrane, external ear and ear canal normal.  Nose: Mucosal edema and rhinorrhea present. No nose lacerations, sinus tenderness, nasal deformity, septal deviation or nasal septal hematoma. No epistaxis.  No foreign bodies. Right sinus exhibits no maxillary sinus tenderness and no frontal sinus tenderness. Left sinus exhibits no maxillary sinus tenderness and no frontal sinus tenderness.  Mouth/Throat: Oropharynx is clear and moist and mucous membranes are normal. Mucous membranes are not pale, not dry and not cyanotic. No uvula swelling. No oropharyngeal exudate, posterior oropharyngeal edema, posterior oropharyngeal erythema or tonsillar abscesses.  Eyes: Conjunctivae are normal. Right eye exhibits no discharge. Left eye exhibits no discharge. No scleral icterus.  Neck: Normal range of motion. Neck supple. No JVD present. No tracheal deviation present. No thyromegaly present.  Cardiovascular: Normal rate, regular rhythm, normal heart sounds and intact distal pulses.  Exam reveals no gallop and no friction rub.   No murmur heard. Pulmonary/Chest: Effort normal and breath sounds normal. No stridor. No respiratory distress. She has no wheezes. She has no rales. She exhibits no tenderness.  Abdominal: Soft. Bowel sounds are normal. She exhibits no distension and no mass. There is no tenderness. There is no rebound and no guarding.  Musculoskeletal: Normal range of motion. She  exhibits no edema and no tenderness.  Lymphadenopathy:    She has no cervical adenopathy.  Neurological: She is oriented to person, place, and  time.  Skin: Skin is warm and dry. No rash noted. She is not diaphoretic. No erythema. No pallor.  Psychiatric: She has a normal mood and affect. Her behavior is normal. Judgment and thought content normal.      Lab Results  Component Value Date   WBC 6.9 07/06/2011   HGB 12.1 07/06/2011   HCT 37.6 07/06/2011   PLT 268.0 07/06/2011   GLUCOSE 92 07/06/2011   CHOL 185 07/06/2011   TRIG 178.0* 07/06/2011   HDL 41.80 07/06/2011   LDLCALC 108* 07/06/2011   ALT 12 07/06/2011   AST 16 07/06/2011   NA 143 07/06/2011   K 3.9 07/06/2011   CL 109 07/06/2011   CREATININE 1.0 07/06/2011   BUN 18 07/06/2011   CO2 27 07/06/2011      Assessment & Plan:

## 2011-07-24 NOTE — Assessment & Plan Note (Signed)
She could not afford chantix and has decided to use e-cigs so I encouraged her to continue to try to quit smoking

## 2011-07-24 NOTE — Assessment & Plan Note (Signed)
Start dymista 

## 2011-07-27 ENCOUNTER — Encounter: Payer: Self-pay | Admitting: Internal Medicine

## 2011-07-27 LAB — CULTURE, URINE COMPREHENSIVE

## 2011-08-15 ENCOUNTER — Ambulatory Visit (AMBULATORY_SURGERY_CENTER): Payer: 59

## 2011-08-15 VITALS — Ht 64.0 in | Wt 130.2 lb

## 2011-08-15 DIAGNOSIS — Z1211 Encounter for screening for malignant neoplasm of colon: Secondary | ICD-10-CM

## 2011-08-15 MED ORDER — PEG-KCL-NACL-NASULF-NA ASC-C 100 G PO SOLR: 1.0000 | Freq: Once | ORAL | Status: AC

## 2011-08-16 ENCOUNTER — Telehealth: Payer: Self-pay

## 2011-08-16 NOTE — Telephone Encounter (Signed)
There is no correction to be made, she has blood in your urine which we have documented, there is no reference in chart of "kidney disease"

## 2011-08-16 NOTE — Telephone Encounter (Signed)
Patient called stating that there is a diagnosis of kidney disease listed on her record. Per pt she has had kidney stones several years ago with  Gallbladder removal. Patient is asking if documentation can be corrected

## 2011-08-29 ENCOUNTER — Other Ambulatory Visit: Payer: 59 | Admitting: Gastroenterology

## 2011-08-30 ENCOUNTER — Ambulatory Visit (HOSPITAL_COMMUNITY): Payer: 59

## 2011-08-30 ENCOUNTER — Ambulatory Visit (HOSPITAL_COMMUNITY)
Admission: RE | Admit: 2011-08-30 | Discharge: 2011-08-30 | Disposition: A | Discharge status: Home / Self Care | Payer: 59 | Source: Ambulatory Visit | Attending: Internal Medicine | Admitting: Internal Medicine

## 2011-08-30 DIAGNOSIS — Z1231 Encounter for screening mammogram for malignant neoplasm of breast: Secondary | ICD-10-CM

## 2011-08-31 LAB — HM MAMMOGRAPHY: HM Mammogram: NORMAL

## 2011-09-09 ENCOUNTER — Encounter: Payer: Self-pay | Admitting: Internal Medicine

## 2011-09-11 ENCOUNTER — Ambulatory Visit: Payer: 59 | Admitting: Internal Medicine

## 2013-08-04 ENCOUNTER — Encounter (HOSPITAL_COMMUNITY): Payer: Self-pay | Admitting: Emergency Medicine

## 2013-08-04 ENCOUNTER — Emergency Department (HOSPITAL_COMMUNITY)
Admission: EM | Admit: 2013-08-04 | Discharge: 2013-08-04 | Discharge status: Against Medical Advice | Payer: No Typology Code available for payment source | Attending: Emergency Medicine | Admitting: Emergency Medicine

## 2013-08-04 DIAGNOSIS — H9209 Otalgia, unspecified ear: Secondary | ICD-10-CM | POA: Insufficient documentation

## 2013-08-04 DIAGNOSIS — N189 Chronic kidney disease, unspecified: Secondary | ICD-10-CM | POA: Insufficient documentation

## 2013-08-04 DIAGNOSIS — R51 Headache: Secondary | ICD-10-CM | POA: Insufficient documentation

## 2013-08-04 DIAGNOSIS — J45909 Unspecified asthma, uncomplicated: Secondary | ICD-10-CM | POA: Insufficient documentation

## 2013-08-04 DIAGNOSIS — F17200 Nicotine dependence, unspecified, uncomplicated: Secondary | ICD-10-CM | POA: Insufficient documentation

## 2013-08-04 NOTE — ED Notes (Signed)
Pt reports for the past month her right ear has been hurting her and she has also had sinus congestion.

## 2013-08-05 NOTE — ED Notes (Signed)
Pt did not wish to wait any longer

## 2015-09-16 DIAGNOSIS — Z136 Encounter for screening for cardiovascular disorders: Secondary | ICD-10-CM | POA: Diagnosis not present

## 2015-09-16 DIAGNOSIS — Z720 Tobacco use: Secondary | ICD-10-CM | POA: Diagnosis not present

## 2015-09-16 DIAGNOSIS — H538 Other visual disturbances: Secondary | ICD-10-CM | POA: Diagnosis not present

## 2015-09-16 DIAGNOSIS — K921 Melena: Secondary | ICD-10-CM | POA: Diagnosis not present

## 2015-09-16 DIAGNOSIS — Z0000 Encounter for general adult medical examination without abnormal findings: Secondary | ICD-10-CM | POA: Diagnosis not present

## 2015-09-16 DIAGNOSIS — I10 Essential (primary) hypertension: Secondary | ICD-10-CM | POA: Diagnosis not present

## 2015-09-16 DIAGNOSIS — Z01118 Encounter for examination of ears and hearing with other abnormal findings: Secondary | ICD-10-CM | POA: Diagnosis not present

## 2015-09-16 DIAGNOSIS — K5900 Constipation, unspecified: Secondary | ICD-10-CM | POA: Diagnosis not present

## 2015-09-16 DIAGNOSIS — R109 Unspecified abdominal pain: Secondary | ICD-10-CM | POA: Diagnosis not present

## 2015-09-16 DIAGNOSIS — Z131 Encounter for screening for diabetes mellitus: Secondary | ICD-10-CM | POA: Diagnosis not present

## 2015-09-16 DIAGNOSIS — I517 Cardiomegaly: Secondary | ICD-10-CM | POA: Diagnosis not present

## 2015-10-08 DIAGNOSIS — I10 Essential (primary) hypertension: Secondary | ICD-10-CM | POA: Diagnosis not present

## 2015-10-08 DIAGNOSIS — I517 Cardiomegaly: Secondary | ICD-10-CM | POA: Diagnosis not present

## 2016-01-31 DIAGNOSIS — K5900 Constipation, unspecified: Secondary | ICD-10-CM | POA: Diagnosis not present

## 2016-01-31 DIAGNOSIS — Z23 Encounter for immunization: Secondary | ICD-10-CM | POA: Diagnosis not present

## 2016-01-31 DIAGNOSIS — I10 Essential (primary) hypertension: Secondary | ICD-10-CM | POA: Diagnosis not present

## 2016-03-22 DIAGNOSIS — I10 Essential (primary) hypertension: Secondary | ICD-10-CM | POA: Diagnosis not present

## 2016-03-22 DIAGNOSIS — R21 Rash and other nonspecific skin eruption: Secondary | ICD-10-CM | POA: Diagnosis not present

## 2016-03-22 DIAGNOSIS — F17210 Nicotine dependence, cigarettes, uncomplicated: Secondary | ICD-10-CM | POA: Diagnosis not present

## 2016-03-22 DIAGNOSIS — H9202 Otalgia, left ear: Secondary | ICD-10-CM | POA: Diagnosis not present

## 2016-12-12 DIAGNOSIS — J209 Acute bronchitis, unspecified: Secondary | ICD-10-CM | POA: Diagnosis not present

## 2016-12-12 DIAGNOSIS — F17200 Nicotine dependence, unspecified, uncomplicated: Secondary | ICD-10-CM | POA: Diagnosis not present

## 2016-12-12 DIAGNOSIS — J069 Acute upper respiratory infection, unspecified: Secondary | ICD-10-CM | POA: Diagnosis not present

## 2016-12-12 DIAGNOSIS — M67441 Ganglion, right hand: Secondary | ICD-10-CM | POA: Diagnosis not present

## 2017-01-01 DIAGNOSIS — S0502XA Injury of conjunctiva and corneal abrasion without foreign body, left eye, initial encounter: Secondary | ICD-10-CM | POA: Diagnosis not present

## 2017-07-18 DIAGNOSIS — J069 Acute upper respiratory infection, unspecified: Secondary | ICD-10-CM | POA: Diagnosis not present

## 2017-08-01 DIAGNOSIS — F17210 Nicotine dependence, cigarettes, uncomplicated: Secondary | ICD-10-CM | POA: Diagnosis not present

## 2017-08-01 DIAGNOSIS — N6320 Unspecified lump in the left breast, unspecified quadrant: Secondary | ICD-10-CM | POA: Diagnosis not present

## 2017-08-01 DIAGNOSIS — I10 Essential (primary) hypertension: Secondary | ICD-10-CM | POA: Diagnosis not present

## 2017-08-01 DIAGNOSIS — M25551 Pain in right hip: Secondary | ICD-10-CM | POA: Diagnosis not present

## 2017-08-06 ENCOUNTER — Other Ambulatory Visit: Payer: Self-pay | Admitting: Family Medicine

## 2017-08-06 DIAGNOSIS — N6320 Unspecified lump in the left breast, unspecified quadrant: Secondary | ICD-10-CM

## 2017-08-09 DIAGNOSIS — M545 Low back pain: Secondary | ICD-10-CM | POA: Diagnosis not present

## 2017-08-09 DIAGNOSIS — M25551 Pain in right hip: Secondary | ICD-10-CM | POA: Diagnosis not present

## 2017-08-14 ENCOUNTER — Ambulatory Visit
Admission: RE | Admit: 2017-08-14 | Discharge: 2017-08-14 | Disposition: A | Discharge status: Home / Self Care | Payer: BLUE CROSS/BLUE SHIELD | Source: Ambulatory Visit | Attending: Family Medicine | Admitting: Family Medicine

## 2017-08-14 ENCOUNTER — Ambulatory Visit
Admission: RE | Admit: 2017-08-14 | Discharge: 2017-08-14 | Disposition: A | Discharge status: Home / Self Care | Payer: 59 | Source: Ambulatory Visit | Attending: Family Medicine | Admitting: Family Medicine

## 2017-08-14 DIAGNOSIS — R928 Other abnormal and inconclusive findings on diagnostic imaging of breast: Secondary | ICD-10-CM | POA: Diagnosis not present

## 2017-08-14 DIAGNOSIS — N6320 Unspecified lump in the left breast, unspecified quadrant: Secondary | ICD-10-CM

## 2017-08-14 DIAGNOSIS — N6489 Other specified disorders of breast: Secondary | ICD-10-CM | POA: Diagnosis not present

## 2017-09-24 DIAGNOSIS — R350 Frequency of micturition: Secondary | ICD-10-CM | POA: Diagnosis not present

## 2017-09-24 DIAGNOSIS — N390 Urinary tract infection, site not specified: Secondary | ICD-10-CM | POA: Diagnosis not present

## 2017-10-10 DIAGNOSIS — L659 Nonscarring hair loss, unspecified: Secondary | ICD-10-CM | POA: Diagnosis not present

## 2017-10-10 DIAGNOSIS — F17210 Nicotine dependence, cigarettes, uncomplicated: Secondary | ICD-10-CM | POA: Diagnosis not present

## 2017-10-10 DIAGNOSIS — Z1159 Encounter for screening for other viral diseases: Secondary | ICD-10-CM | POA: Diagnosis not present

## 2017-12-13 DIAGNOSIS — M25511 Pain in right shoulder: Secondary | ICD-10-CM | POA: Diagnosis not present

## 2018-02-19 DIAGNOSIS — I10 Essential (primary) hypertension: Secondary | ICD-10-CM | POA: Diagnosis not present

## 2018-02-19 DIAGNOSIS — F17210 Nicotine dependence, cigarettes, uncomplicated: Secondary | ICD-10-CM | POA: Diagnosis not present

## 2018-02-19 DIAGNOSIS — F4321 Adjustment disorder with depressed mood: Secondary | ICD-10-CM | POA: Diagnosis not present

## 2018-03-11 DIAGNOSIS — H04123 Dry eye syndrome of bilateral lacrimal glands: Secondary | ICD-10-CM | POA: Diagnosis not present

## 2018-07-02 DIAGNOSIS — L603 Nail dystrophy: Secondary | ICD-10-CM | POA: Diagnosis not present

## 2018-07-02 DIAGNOSIS — D2372 Other benign neoplasm of skin of left lower limb, including hip: Secondary | ICD-10-CM | POA: Diagnosis not present

## 2018-07-02 DIAGNOSIS — B351 Tinea unguium: Secondary | ICD-10-CM | POA: Diagnosis not present

## 2018-07-02 DIAGNOSIS — L309 Dermatitis, unspecified: Secondary | ICD-10-CM | POA: Diagnosis not present

## 2018-07-17 DIAGNOSIS — B351 Tinea unguium: Secondary | ICD-10-CM | POA: Diagnosis not present

## 2018-07-17 DIAGNOSIS — Z79899 Other long term (current) drug therapy: Secondary | ICD-10-CM | POA: Diagnosis not present

## 2018-08-30 DIAGNOSIS — F17210 Nicotine dependence, cigarettes, uncomplicated: Secondary | ICD-10-CM | POA: Diagnosis not present

## 2018-08-30 DIAGNOSIS — H93A1 Pulsatile tinnitus, right ear: Secondary | ICD-10-CM | POA: Diagnosis not present

## 2018-08-30 DIAGNOSIS — J309 Allergic rhinitis, unspecified: Secondary | ICD-10-CM | POA: Diagnosis not present

## 2018-08-30 DIAGNOSIS — I10 Essential (primary) hypertension: Secondary | ICD-10-CM | POA: Diagnosis not present

## 2018-11-29 ENCOUNTER — Other Ambulatory Visit: Payer: Self-pay

## 2018-11-29 ENCOUNTER — Encounter (HOSPITAL_COMMUNITY): Payer: Self-pay | Admitting: Emergency Medicine

## 2018-11-29 ENCOUNTER — Inpatient Hospital Stay (HOSPITAL_COMMUNITY)
Admission: EM | Admit: 2018-11-29 | Discharge: 2018-12-04 | DRG: 177 | Disposition: A | Discharge status: Home / Self Care | Payer: Medicare Other | Attending: Internal Medicine | Admitting: Internal Medicine

## 2018-11-29 ENCOUNTER — Ambulatory Visit (HOSPITAL_COMMUNITY)
Admission: EM | Admit: 2018-11-29 | Discharge: 2018-11-29 | Disposition: A | Discharge status: Home / Self Care | Payer: Self-pay | Attending: Family Medicine | Admitting: Family Medicine

## 2018-11-29 ENCOUNTER — Emergency Department (HOSPITAL_COMMUNITY): Payer: Medicare Other

## 2018-11-29 DIAGNOSIS — Z79899 Other long term (current) drug therapy: Secondary | ICD-10-CM

## 2018-11-29 DIAGNOSIS — Z20828 Contact with and (suspected) exposure to other viral communicable diseases: Secondary | ICD-10-CM

## 2018-11-29 DIAGNOSIS — Z720 Tobacco use: Secondary | ICD-10-CM | POA: Diagnosis present

## 2018-11-29 DIAGNOSIS — R0602 Shortness of breath: Secondary | ICD-10-CM | POA: Diagnosis not present

## 2018-11-29 DIAGNOSIS — R05 Cough: Secondary | ICD-10-CM

## 2018-11-29 DIAGNOSIS — E876 Hypokalemia: Secondary | ICD-10-CM | POA: Diagnosis present

## 2018-11-29 DIAGNOSIS — R0981 Nasal congestion: Secondary | ICD-10-CM

## 2018-11-29 DIAGNOSIS — N182 Chronic kidney disease, stage 2 (mild): Secondary | ICD-10-CM | POA: Diagnosis not present

## 2018-11-29 DIAGNOSIS — R0902 Hypoxemia: Secondary | ICD-10-CM

## 2018-11-29 DIAGNOSIS — U071 COVID-19: Secondary | ICD-10-CM | POA: Diagnosis not present

## 2018-11-29 DIAGNOSIS — J1289 Other viral pneumonia: Secondary | ICD-10-CM | POA: Diagnosis present

## 2018-11-29 DIAGNOSIS — Z90710 Acquired absence of both cervix and uterus: Secondary | ICD-10-CM

## 2018-11-29 DIAGNOSIS — J1282 Pneumonia due to coronavirus disease 2019: Secondary | ICD-10-CM

## 2018-11-29 DIAGNOSIS — J9601 Acute respiratory failure with hypoxia: Secondary | ICD-10-CM | POA: Diagnosis present

## 2018-11-29 DIAGNOSIS — F17210 Nicotine dependence, cigarettes, uncomplicated: Secondary | ICD-10-CM | POA: Diagnosis present

## 2018-11-29 DIAGNOSIS — F329 Major depressive disorder, single episode, unspecified: Secondary | ICD-10-CM | POA: Diagnosis present

## 2018-11-29 DIAGNOSIS — I129 Hypertensive chronic kidney disease with stage 1 through stage 4 chronic kidney disease, or unspecified chronic kidney disease: Secondary | ICD-10-CM | POA: Diagnosis present

## 2018-11-29 DIAGNOSIS — J45909 Unspecified asthma, uncomplicated: Secondary | ICD-10-CM | POA: Diagnosis present

## 2018-11-29 DIAGNOSIS — I10 Essential (primary) hypertension: Secondary | ICD-10-CM | POA: Diagnosis present

## 2018-11-29 DIAGNOSIS — Z20822 Contact with and (suspected) exposure to covid-19: Secondary | ICD-10-CM

## 2018-11-29 DIAGNOSIS — F32A Depression, unspecified: Secondary | ICD-10-CM | POA: Diagnosis present

## 2018-11-29 DIAGNOSIS — J069 Acute upper respiratory infection, unspecified: Secondary | ICD-10-CM | POA: Diagnosis not present

## 2018-11-29 LAB — CBC WITH DIFFERENTIAL/PLATELET
Abs Immature Granulocytes: 0.1 10*3/uL — ABNORMAL HIGH (ref 0.00–0.07)
Basophils Absolute: 0 10*3/uL (ref 0.0–0.1)
Basophils Relative: 0 %
Eosinophils Absolute: 0 10*3/uL (ref 0.0–0.5)
Eosinophils Relative: 0 %
HCT: 34.5 % — ABNORMAL LOW (ref 36.0–46.0)
Hemoglobin: 11.3 g/dL — ABNORMAL LOW (ref 12.0–15.0)
Immature Granulocytes: 1 %
Lymphocytes Relative: 12 %
Lymphs Abs: 1 10*3/uL (ref 0.7–4.0)
MCH: 26.1 pg (ref 26.0–34.0)
MCHC: 32.8 g/dL (ref 30.0–36.0)
MCV: 79.7 fL — ABNORMAL LOW (ref 80.0–100.0)
Monocytes Absolute: 0.5 10*3/uL (ref 0.1–1.0)
Monocytes Relative: 5 %
Neutro Abs: 7.1 10*3/uL (ref 1.7–7.7)
Neutrophils Relative %: 82 %
Platelets: 381 10*3/uL (ref 150–400)
RBC: 4.33 MIL/uL (ref 3.87–5.11)
RDW: 13.2 % (ref 11.5–15.5)
WBC: 8.7 10*3/uL (ref 4.0–10.5)
nRBC: 0 % (ref 0.0–0.2)

## 2018-11-29 LAB — COMPREHENSIVE METABOLIC PANEL
ALT: 27 U/L (ref 0–44)
AST: 43 U/L — ABNORMAL HIGH (ref 15–41)
Albumin: 2.9 g/dL — ABNORMAL LOW (ref 3.5–5.0)
Alkaline Phosphatase: 95 U/L (ref 38–126)
Anion gap: 17 — ABNORMAL HIGH (ref 5–15)
BUN: 10 mg/dL (ref 8–23)
CO2: 26 mmol/L (ref 22–32)
Calcium: 9 mg/dL (ref 8.9–10.3)
Chloride: 91 mmol/L — ABNORMAL LOW (ref 98–111)
Creatinine, Ser: 1.05 mg/dL — ABNORMAL HIGH (ref 0.44–1.00)
GFR calc Af Amer: >60 mL/min (ref >60)
GFR calc non Af Amer: 56 mL/min — ABNORMAL LOW (ref >60)
Glucose, Bld: 130 mg/dL — ABNORMAL HIGH (ref 70–99)
Potassium: 3.8 mmol/L (ref 3.5–5.1)
Sodium: 134 mmol/L — ABNORMAL LOW (ref 135–145)
Total Bilirubin: 1.1 mg/dL (ref 0.3–1.2)
Total Protein: 7.5 g/dL (ref 6.5–8.1)

## 2018-11-29 LAB — D-DIMER, QUANTITATIVE: D-Dimer, Quant: 0.99 ug/mL-FEU — ABNORMAL HIGH (ref 0.00–0.50)

## 2018-11-29 LAB — C-REACTIVE PROTEIN: CRP: 37.7 mg/dL — ABNORMAL HIGH (ref <1.0)

## 2018-11-29 LAB — SARS CORONAVIRUS 2 BY RT PCR (HOSPITAL ORDER, PERFORMED IN ~~LOC~~ HOSPITAL LAB): SARS Coronavirus 2: POSITIVE — AB

## 2018-11-29 LAB — FIBRINOGEN: Fibrinogen: >800 mg/dL — ABNORMAL HIGH (ref 210–475)

## 2018-11-29 LAB — LACTIC ACID, PLASMA
Lactic Acid, Venous: 2.1 mmol/L (ref 0.5–1.9)
Lactic Acid, Venous: 2.8 mmol/L (ref 0.5–1.9)

## 2018-11-29 LAB — LACTATE DEHYDROGENASE: LDH: 324 U/L — ABNORMAL HIGH (ref 98–192)

## 2018-11-29 LAB — PROCALCITONIN: Procalcitonin: 0.23 ng/mL

## 2018-11-29 LAB — FERRITIN: Ferritin: 279 ng/mL (ref 11–307)

## 2018-11-29 LAB — TRIGLYCERIDES: Triglycerides: 122 mg/dL (ref <150)

## 2018-11-29 MED ORDER — SODIUM CHLORIDE 0.9 % IV SOLN
1000.0000 mL | INTRAVENOUS | Status: DC
  Administered 2018-11-30: 02:00:00 1000 mL via INTRAVENOUS

## 2018-11-29 NOTE — ED Triage Notes (Signed)
Pain left ear and left neck Headache Sob Patient reports fever 101.5 Onset 11/20/2018 Patient reports family member was diagnosed with covid 19-granddaughter lives in her home.  Also patients daughter lives in the home, treated for pneumonia and then tested positive for covid 19.    Patient has not benn tested

## 2018-11-29 NOTE — ED Provider Notes (Signed)
[facility] EMERGENCY DEPARTMENT Provider Note   CSN: [id number] Arrival date & time: 11/29/18  2027     History   Chief Complaint Chief Complaint  Patient presents with  . Shortness of Breath    HPI Nichole Hanson is a 63 y.o. female.     HPI  The patient is a 63 year old female, she has a known history of tobacco use, she has no other known history of lung disease other than occasional bronchitis.  She is living in a house with multiple family members who have had a coronavirus and states that in the last week she has developed a feeling of nausea as well as poor appetite and increasing shortness of breath with cough.  She denies chest pain, she does endorse fevers as high as 101.9.  Her symptoms have been persistent, they have been gradually worsening and her family members finally forced her to come to the hospital today for evaluation.  The patient denies diarrhea, vomiting, rashes and has no chest pain at this time.  She has not been taking any medications prehospital.  She arrives with hypoxia sats in 85% range.  She does not use oxygen at home.  Past Medical History:  Diagnosis Date  . Allergic rhinitis, cause unspecified 07/24/2011  . Allergy   . Arthritis   . Asthma   . Chronic kidney disease    stones    Patient Active Problem List   Diagnosis Date Noted  . Allergic rhinitis, cause unspecified 07/24/2011  . Hematuria 07/07/2011  . Tobacco abuse 07/06/2011  . Routine general medical examination at a health care facility 07/06/2011  . Family history of colon cancer 07/06/2011  . Other screening mammogram 07/06/2011    Past Surgical History:  Procedure Laterality Date  . ABDOMINAL HYSTERECTOMY    . APPENDECTOMY    . CESAREAN SECTION    . CHOLECYSTECTOMY       OB History   No obstetric history on file.      Home Medications    Prior to Admission medications   Medication Sig Start Date End Date Taking? Authorizing Provider   acetaminophen (TYLENOL) 325 MG tablet Take 650 mg by mouth every 6 (six) hours as needed.    [provider]  BLACK COHOSH PO Take 1 tablet by mouth daily.     [provider]  buPROPion (WELLBUTRIN XL) 150 MG 24 hr tablet Take 150 mg by mouth daily.    [provider]  guaiFENesin 200 MG tablet Take 200 mg by mouth every 4 (four) hours as needed for cough or to loosen phlegm.    [provider]  hydrochlorothiazide (HYDRODIURIL) 12.5 MG tablet Take 12.5 mg by mouth daily.    [provider]  ibuprofen (ADVIL,MOTRIN) 200 MG tablet Take 200 mg by mouth.    [provider]  NON FORMULARY Niwali sacred hair growth -Take 2 capsules daily    [provider]  NON FORMULARY [employer]ite Natural Herbs-Take one nightly    [provider]    Family History Family History  Problem Relation Age of Onset  . Arthritis Mother   . Colon cancer Father   . Diabetes Paternal Aunt   . Diabetes Paternal Aunt   . Diabetes Sister   . Diabetes Sister   . Heart disease Neg Hx   . Hyperlipidemia Neg Hx   . Hypertension Neg Hx   . Kidney disease Neg Hx   . Stroke Neg Hx  Social History Social History   Tobacco Use  . Smoking status: Current Every Day Smoker    Packs/day: 0.80    Years: 25.00    Pack years: 20.00    Types: Cigarettes  . Smokeless tobacco: Never Used  Substance Use Topics  . Alcohol use: No  . Drug use: No     Allergies   Asa arthritis strength-antacid [aspirin buffered]   Review of Systems Review of Systems  All other systems reviewed and are negative.    Physical Exam Updated Vital Signs BP 127/79 (BP Location: Right Arm)   Pulse (!) 101   Temp 99 F (37.2 C) (Oral)   Resp 20   SpO2 (!) 85%   Physical Exam Vitals signs and nursing note reviewed.  Constitutional:      General: She is not in acute distress.    Appearance: She is well-developed.  HENT:     Head: Normocephalic and atraumatic.      Mouth/Throat:     Pharynx: No oropharyngeal exudate.  Eyes:     General: No scleral icterus.       Right eye: No discharge.        Left eye: No discharge.     Conjunctiva/sclera: Conjunctivae normal.     Pupils: Pupils are equal, round, and reactive to light.  Neck:     Musculoskeletal: Normal range of motion and neck supple.     Thyroid: No thyromegaly.     Vascular: No JVD.  Cardiovascular:     Rate and Rhythm: Normal rate and regular rhythm.     Heart sounds: Normal heart sounds. No murmur. No friction rub. No gallop.   Pulmonary:     Effort: Pulmonary effort is normal. No respiratory distress.     Breath sounds: Examination of the right-lower field reveals rales. Examination of the left-lower field reveals rales. Rales present. No wheezing.  Abdominal:     General: Bowel sounds are normal. There is no distension.     Palpations: Abdomen is soft. There is no mass.     Tenderness: There is no abdominal tenderness.  Musculoskeletal: Normal range of motion.        General: No tenderness.  Lymphadenopathy:     Cervical: No cervical adenopathy.  Skin:    General: Skin is warm and dry.     Findings: No erythema or rash.  Neurological:     Mental Status: She is alert.     Coordination: Coordination normal.  Psychiatric:        Behavior: Behavior normal.      ED Treatments / Results  Labs (all labs ordered are listed, but only abnormal results are displayed) Labs Reviewed  CULTURE, BLOOD (ROUTINE X 2)  CULTURE, BLOOD (ROUTINE X 2)  SARS CORONAVIRUS 2 (HOSPITAL ORDER, [place])  CBC WITH DIFFERENTIAL/PLATELET  COMPREHENSIVE METABOLIC PANEL  LACTIC ACID, PLASMA  LACTIC ACID, PLASMA  D-DIMER, QUANTITATIVE (NOT AT [facility])  PROCALCITONIN  LACTATE DEHYDROGENASE  FERRITIN  TRIGLYCERIDES  FIBRINOGEN  C-REACTIVE PROTEIN    EKG EKG Interpretation  Date/Time:  Friday November 29 2018 21:28:26 EDT Ventricular Rate:  90 PR Interval:    QRS  Duration: 91 QT Interval:  360 QTC Calculation: 441 R Axis:   60 Text Interpretation:  Sinus rhythm Borderline T abnormalities, anterior leads Artifact in lead(s) I III aVR aVL and baseline wander in lead(s) V2 No old tracing to compare Confirmed by Noemi Chapel [phone]) on 11/29/2018 9:36:36 PM  Radiology No results found.  Procedures Procedures (including critical care time)  Medications Ordered in ED Medications  0.9 %  sodium chloride infusion (has no administration in time range)     Initial Impression / Assessment and Plan / ED Course  I have reviewed the triage vital signs and the nursing notes.  Pertinent labs & imaging results that were available during my care of the patient were reviewed by me and considered in my medical decision making (see chart for details).  Clinical Course as of Nov 30 1335  Fri Nov 29, 2018  2303 No definite evidence of pneumonia on x-ray however the patient does have rales, hypoxia and multiple findings of elevated inflammatory markers with multiple sick contacts in her house positive for COVID.  Pending COVID test here.  Anticipate transfer to [place] or admission at minimum.   [BM]  2322 Dr. Tyrone Nine will call for admission once results are back.   [BM]    Clinical Course User Index [BM] Noemi Chapel, MD      Thankfully the patient's oxygen level corrects with supplemental oxygen however without she is at 85%.  She goes up to 93 or 94% at 2 or 3 L.  Her EKG is unremarkable, her clinical exam is consistent with bibasilar infiltrates which I would suspect with COVID pneumonia.  At this time the patient will need to have further evaluation with labs and likely admission and transfer to [facility] where she can be hospitalized and treated appropriately for coronavirus.  She is an oxygen requirement that is not suitable for home.  Final Clinical Impressions(s) / ED Diagnoses   Final diagnoses:  Pneumonia due to COVID-19  virus    ED Discharge Orders    None       Noemi Chapel, MD 11/30/18 1337

## 2018-11-29 NOTE — ED Triage Notes (Signed)
Patient with shortness of breath, she states she was at [facility] and her oxygen sats were low.  Patient has multiple family members that have Covid.

## 2018-11-29 NOTE — Discharge Instructions (Signed)
Please go to the ER for further evaluation as your oxygen level is lower than we would like it to be, especially with the symptoms and exposure you have had.

## 2018-11-29 NOTE — H&P (Signed)
History and Physical    Nichole Hanson MAU:[id number] DOB: July 26, 1955 DOA: 11/29/2018  Referring MD/NP/PA:   PCP: Robyne Peers, MD   Patient coming from:  The patient is coming from home.  At baseline, pt is independent for most of ADL.        Chief Complaint: Cough, shortness of breath, fever and chills  HPI: Nichole Hanson is a 63 y.o. female with medical history significant of hypertension, asthma, depression, CKD-2, tobacco abuse, who presents with cough, shortness of breath, fever and chills.  Patient states that she has been having cough, shortness of breath, fever and chills for almost 10 days, which has worsened today.  She denies chest pain.  Patient states that her family member has positive COVID-19 recently.  She has some nausea, but no vomiting, diarrhea or abdominal pain.  No symptoms of UTI or unilateral weakness.  Patient was seen in urgent care, found to have oxygen desaturation today.  ED Course: pt was found to have positive COVID-19, WBC 8.7, lactic acid of 2.1, 2.8, d-dimer 0.99, CRP 37.7, LDH 324, procalcitonin 0.23, ferritin 279, fibrinogen 800, TG 122.  Stable renal function.  Temperature 99, tachycardia, tachypnea, oxygen saturation 85% on room air, chest x-ray negative for infiltration.  Patient is placed on telemetry bed for observation.  Review of Systems:   General: has fevers, chills, no body weight gain, has poor appetite, has fatigue HEENT: no blurry vision, hearing changes or sore throat Respiratory: has dyspnea, coughing, no wheezing CV: no chest pain, no palpitations GI: has nausea, no vomiting, abdominal pain, diarrhea, constipation GU: no dysuria, burning on urination, increased urinary frequency, hematuria  Ext: no leg edema Neuro: no unilateral weakness, numbness, or tingling, no vision change or hearing loss Skin: no rash, no skin tear. MSK: No muscle spasm, no deformity, no limitation of range of movement in spin Heme: No easy bruising.   Travel history: No recent long distant travel.  Allergy:  Allergies  Allergen Reactions  . Asa Arthritis Strength-Antacid [Aspirin Buffered] Other (See Comments)    Tingling on one side body-PATIENT DENIES THIS IS AN ALLERGY    Past Medical History:  Diagnosis Date  . Allergic rhinitis, cause unspecified 07/24/2011  . Allergy   . Arthritis   . Asthma   . Chronic kidney disease    stones    Past Surgical History:  Procedure Laterality Date  . ABDOMINAL HYSTERECTOMY    . APPENDECTOMY    . CESAREAN SECTION    . CHOLECYSTECTOMY      Social History:  reports that she has been smoking cigarettes. She has a 20.00 pack-year smoking history. She has never used smokeless tobacco. She reports that she does not drink alcohol or use drugs.  Family History:  Family History  Problem Relation Age of Onset  . Arthritis Mother   . Colon cancer Father   . Diabetes Paternal Aunt   . Diabetes Paternal Aunt   . Diabetes Sister   . Diabetes Sister   . Heart disease Neg Hx   . Hyperlipidemia Neg Hx   . Hypertension Neg Hx   . Kidney disease Neg Hx   . Stroke Neg Hx      Prior to Admission medications   Medication Sig Start Date End Date Taking? Authorizing Provider  acetaminophen (TYLENOL) 325 MG tablet Take 650 mg by mouth every 6 (six) hours as needed.    [provider]  BLACK COHOSH PO Take 1 tablet by mouth daily.  [provider]  buPROPion (WELLBUTRIN XL) 150 MG 24 hr tablet Take 150 mg by mouth daily.    [provider]  guaiFENesin 200 MG tablet Take 200 mg by mouth every 4 (four) hours as needed for cough or to loosen phlegm.    [provider]  hydrochlorothiazide (HYDRODIURIL) 12.5 MG tablet Take 12.5 mg by mouth daily.    [provider]  ibuprofen (ADVIL,MOTRIN) 200 MG tablet Take 200 mg by mouth.    [provider]  NON FORMULARY Niwali sacred hair growth -Take 2 capsules daily    [provider]  NON  FORMULARY [employer] Natural Herbs-Take one nightly    [provider]    Physical Exam: Vitals:   11/30/18 0515 11/30/18 0530 11/30/18 0545 11/30/18 0600  BP: 106/71 114/69 113/70 108/67  Pulse: 76 81 82 78  Resp: (!) 21 (!) 30 (!) 32 (!) 26  Temp:      TempSrc:      SpO2: 93% 92% 92% 93%  Weight:      Height:       General: Not in acute distress HEENT:       Eyes: PERRL, EOMI, no scleral icterus.       ENT: No discharge from the ears and nose, no pharynx injection, no tonsillar enlargement.        Neck: No JVD, no bruit, no mass felt. Heme: No neck lymph node enlargement. Cardiac: S1/S2, RRR, No murmurs, No gallops or rubs. Respiratory: No rales, wheezing, rhonchi or rubs. GI: Soft, nondistended, nontender, no rebound pain, no organomegaly, BS present. GU: No hematuria Ext: No pitting leg edema bilaterally. 2+DP/PT pulse bilaterally. Musculoskeletal: No joint deformities, No joint redness or warmth, no limitation of ROM in spin. Skin: No rashes.  Neuro: Alert, oriented X3, cranial nerves II-XII grossly intact, moves all extremities normally.  Psych: Patient is not psychotic, no suicidal or hemocidal ideation.  Labs on Admission: I have personally reviewed following labs and imaging studies  CBC: Recent Labs  Lab 11/29/18 2059  WBC 8.7  NEUTROABS 7.1  HGB 11.3*  HCT 34.5*  MCV 79.7*  PLT 941   Basic Metabolic Panel: Recent Labs  Lab 11/29/18 2059  NA 134*  K 3.8  CL 91*  CO2 26  GLUCOSE 130*  BUN 10  CREATININE 1.05*  CALCIUM 9.0   GFR: Estimated Creatinine Clearance: 46.1 mL/min (A) (by C-G formula based on SCr of 1.05 mg/dL (H)). Liver Function Tests: Recent Labs  Lab 11/29/18 2059  AST 43*  ALT 27  ALKPHOS 95  BILITOT 1.1  PROT 7.5  ALBUMIN 2.9*   No results for input(s): LIPASE, AMYLASE in the last 168 hours. No results for input(s): AMMONIA in the last 168 hours. Coagulation Profile: No results for input(s): INR, PROTIME in the  last 168 hours. Cardiac Enzymes: No results for input(s): CKTOTAL, CKMB, CKMBINDEX, TROPONINI in the last 168 hours. BNP (last 3 results) No results for input(s): PROBNP in the last 8760 hours. HbA1C: No results for input(s): HGBA1C in the last 72 hours. CBG: No results for input(s): GLUCAP in the last 168 hours. Lipid Profile: Recent Labs    11/29/18 2059  TRIG 122   Thyroid Function Tests: No results for input(s): TSH, T4TOTAL, FREET4, T3FREE, THYROIDAB in the last 72 hours. Anemia Panel: Recent Labs    11/29/18 2059  FERRITIN 279   Urine analysis:    Component Value Date/Time   COLORURINE LT. YELLOW 07/24/2011 1031  APPEARANCEUR CLEAR 07/24/2011 1031   LABSPEC 1.010 07/24/2011 1031   PHURINE 7.5 07/24/2011 1031   GLUCOSEU NEGATIVE 07/24/2011 1031   HGBUR SMALL 07/24/2011 1031   BILIRUBINUR NEGATIVE 07/24/2011 1031   [place] 07/24/2011 1031   UROBILINOGEN 0.2 07/24/2011 1031   NITRITE NEGATIVE 07/24/2011 1031   LEUKOCYTESUR MODERATE 07/24/2011 1031   Sepsis Labs: @LABRCNTIP (procalcitonin:4,lacticidven:4) ) Recent Results (from the past 240 hour(s))  SARS Coronavirus 2 [facility] order, Performed in [place] hospital lab)     Status: Abnormal   Collection Time: 11/29/18 10:08 PM   Specimen: Nasopharyngeal Swab  Result Value Ref Range Status   SARS Coronavirus 2 POSITIVE (A) NEGATIVE Final    Comment: RESULT CALLED TO, READ BACK BY AND VERIFIED WITH: Bennie Hind RN 11/29/18 2323 JDW (NOTE) If result is NEGATIVE SARS-CoV-2 target nucleic acids are NOT DETECTED. The SARS-CoV-2 RNA is generally detectable in upper and lower  respiratory specimens during the acute phase of infection. The lowest  concentration of SARS-CoV-2 viral copies this assay can detect is 250  copies / mL. A negative result does not preclude SARS-CoV-2 infection  and should not be used as the sole basis for treatment or other  patient management decisions.  A negative result may  occur with  improper specimen collection / handling, submission of specimen other  than nasopharyngeal swab, presence of viral mutation(s) within the  areas targeted by this assay, and inadequate number of viral copies  (<250 copies / mL). A negative result must be combined with clinical  observations, patient history, and epidemiological information. If result is POSITIVE SARS-CoV-2 target nucleic acids are DETECTED. The SAR S-CoV-2 RNA is generally detectable in upper and lower  respiratory specimens during the acute phase of infection.  Positive  results are indicative of active infection with SARS-CoV-2.  Clinical  correlation with patient history and other diagnostic information is  necessary to determine patient infection status.  Positive results do  not rule out bacterial infection or co-infection with other viruses. If result is PRESUMPTIVE POSTIVE SARS-CoV-2 nucleic acids MAY BE PRESENT.   A presumptive positive result was obtained on the submitted specimen  and confirmed on repeat testing.  While 2019 novel coronavirus  (SARS-CoV-2) nucleic acids may be present in the submitted sample  additional confirmatory testing may be necessary for epidemiological  and / or clinical management purposes  to differentiate between  SARS-CoV-2 and other Sarbecovirus currently known to infect humans.  If clinically indicated additional testing with an alternate test  methodology [phone]) is advis ed. The SARS-CoV-2 RNA is generally  detectable in upper and lower respiratory specimens during the acute  phase of infection. The expected result is Negative. Fact Sheet for Patients:  StrictlyIdeas.no Fact Sheet for Healthcare Providers: BankingDealers.co.za This test is not yet approved or cleared by the [place] FDA and has been authorized for detection and/or diagnosis of SARS-CoV-2 by FDA under an Emergency Use Authorization (EUA).  This  EUA will remain in effect (meaning this test can be used) for the duration of the COVID-19 declaration under Section 564(b)(1) of the Act, 21 U.S.C. section 360bbb-3(b)(1), unless the authorization is terminated or revoked sooner. Performed at [facility], [place] [address] [place], [place] [address]      Radiological Exams on Admission: Dg Chest Portable 1 View  Result Date: 11/29/2018 CLINICAL DATA:  63 year old female with shortness of breath. EXAM: PORTABLE CHEST 1 VIEW COMPARISON:  None. FINDINGS: Background of emphysema and chronic interstitial coarsening. Left  lung base linear atelectasis/scarring. No focal consolidation, pleural effusion, or pneumothorax. The cardiac silhouette is within normal limits. No acute osseous pathology. IMPRESSION: No active disease. Electronically Signed   By: Anner Crete M.D.   On: 11/29/2018 22:15     EKG: Independently reviewed.  Sinus rhythm, QTC 441, early R wave progression, nonspecific T wave change.  Assessment/Plan Principal Problem:   Acute respiratory disease due to COVID-19 virus Active Problems:   Tobacco abuse   CKD (chronic kidney disease), stage II   HTN (hypertension)   Depression   Acute respiratory disease due to COVID-19 virus: Patient has oxygen desaturation, but no infiltration on chest x-ray.  Does not meet criteria for using Remdesivir .  - will place on tele bed for obs -Solumedrol 30 mg bid -vitamin C, zinc.   -Atrovent inhaler, PRN Xopenex inhaler -PRN Mucinex for cough -Follow-up flu PCR and RVP -f/u Blood culture -Gentle IV fluid: NS 75 cc/h (patient is still tachycardia) -D-dimer, BNP,Trop, LFT, CRP, LDH, Procalcitonin, IL-6, ferritin, fibinogen, TG, Hep B SAg, HIV ab -Daily CRP, Ferritin, D-dimer, -Will ask the patient to maintain an awake prone position for 16+ hours a day, if possible, with a minimum of 2-3 hours at a time -Will attempt to maintain euvolemia to a net negative fluid status  -Patient was seen wearing full PPE including: gown, gloves, head cover, N95 -IF patient deteriorates, will consult PCCM and ID  Tobacco abuse: -Did counseling about importance of quitting smoking -Nicotine patch  CKD (chronic kidney disease), stage II: stable. Cre 1.05 and BUN 10. -f/u by BMP  HTN: Blood pressure 127/79. -hold HCTZ since patient need IV fluid. -IV hydralazine as needed  Depression: -Continue Wellbutrin    DVT ppx: SQ Lovenox Code Status: Full code Family Communication: None at bed side.    Disposition Plan:  Anticipate discharge back to previous home environment Consults called: None Admission status: Obs / tele    Date of Service 11/30/2018    [facility]   If 7PM-7AM, please contact night-coverage [email or web address] Password [facility] 11/30/2018, 7:19 AM

## 2018-11-29 NOTE — ED Provider Notes (Signed)
Nichole Hanson presents with complaints of cough, congestion and shortness of breath . Started approximately 7/15. She has had family members test positive for covid-19. She has had fevers. She feels like today she feels mildly improved. On arrival in triage patient resting with O2 at 87-88%, only occasionally reaching 90%. Patient does smoke. Mild tachypnea noted as well. Patient speaking in clear sentences without difficulty. Patient denies any history of hypoxia. Recommend further evaluation and treatment in the ER at this time. Patient husband is driving her directly down, she was wheeled to the vehicle.        Zigmund Gottron, NP 11/29/18 2013

## 2018-11-29 NOTE — ED Notes (Signed)
Provider bedside, pt 85% on RA, placed back on 2 Lt, returned to 95%.

## 2018-11-29 NOTE — ED Provider Notes (Signed)
63 yo F with a chief complaints of shortness of breath cough.  Is been going on for about a week.  She is multiple family members that have tested positive for the novel coronavirus.  Went to urgent care and was found to be hypoxic and was sent here for evaluation.  Oxygen saturation in the mid 80s improved with 2 L of oxygen.  Coronavirus test is positive.  Will discuss with hospitalist for admission.   Deno Etienne, DO 11/29/18 2341

## 2018-11-29 NOTE — ED Notes (Signed)
Nichole Hanson Phlebotomy to get blood work

## 2018-11-30 ENCOUNTER — Observation Stay (HOSPITAL_COMMUNITY): Payer: Medicare Other

## 2018-11-30 DIAGNOSIS — F32A Depression, unspecified: Secondary | ICD-10-CM | POA: Diagnosis present

## 2018-11-30 DIAGNOSIS — U071 COVID-19: Secondary | ICD-10-CM | POA: Diagnosis present

## 2018-11-30 DIAGNOSIS — J1289 Other viral pneumonia: Secondary | ICD-10-CM | POA: Diagnosis present

## 2018-11-30 DIAGNOSIS — N182 Chronic kidney disease, stage 2 (mild): Secondary | ICD-10-CM | POA: Diagnosis present

## 2018-11-30 DIAGNOSIS — I10 Essential (primary) hypertension: Secondary | ICD-10-CM | POA: Diagnosis not present

## 2018-11-30 DIAGNOSIS — J45909 Unspecified asthma, uncomplicated: Secondary | ICD-10-CM | POA: Diagnosis present

## 2018-11-30 DIAGNOSIS — J069 Acute upper respiratory infection, unspecified: Secondary | ICD-10-CM | POA: Diagnosis not present

## 2018-11-30 DIAGNOSIS — J9601 Acute respiratory failure with hypoxia: Secondary | ICD-10-CM | POA: Diagnosis present

## 2018-11-30 DIAGNOSIS — F329 Major depressive disorder, single episode, unspecified: Secondary | ICD-10-CM | POA: Diagnosis present

## 2018-11-30 DIAGNOSIS — F17210 Nicotine dependence, cigarettes, uncomplicated: Secondary | ICD-10-CM | POA: Diagnosis present

## 2018-11-30 DIAGNOSIS — Z90710 Acquired absence of both cervix and uterus: Secondary | ICD-10-CM | POA: Diagnosis not present

## 2018-11-30 DIAGNOSIS — Z720 Tobacco use: Secondary | ICD-10-CM | POA: Diagnosis not present

## 2018-11-30 DIAGNOSIS — I129 Hypertensive chronic kidney disease with stage 1 through stage 4 chronic kidney disease, or unspecified chronic kidney disease: Secondary | ICD-10-CM | POA: Diagnosis present

## 2018-11-30 DIAGNOSIS — Z79899 Other long term (current) drug therapy: Secondary | ICD-10-CM | POA: Diagnosis not present

## 2018-11-30 DIAGNOSIS — E876 Hypokalemia: Secondary | ICD-10-CM | POA: Diagnosis present

## 2018-11-30 DIAGNOSIS — R0602 Shortness of breath: Secondary | ICD-10-CM | POA: Diagnosis present

## 2018-11-30 LAB — TROPONIN I (HIGH SENSITIVITY): Troponin I (High Sensitivity): 5 ng/L (ref <18)

## 2018-11-30 LAB — TYPE AND SCREEN
ABO/RH(D): A POS
Antibody Screen: NEGATIVE

## 2018-11-30 LAB — SEDIMENTATION RATE: Sed Rate: >140 mm/hr — ABNORMAL HIGH (ref 0–22)

## 2018-11-30 MED ORDER — LEVALBUTEROL TARTRATE 45 MCG/ACT IN AERO: 2.0000 | INHALATION_SPRAY | Freq: Four times a day (QID) | RESPIRATORY_TRACT | Status: DC | PRN

## 2018-11-30 MED ORDER — VITAMIN C 500 MG PO TABS
500.0000 mg | ORAL_TABLET | Freq: Every day | ORAL | Status: DC
  Administered 2018-11-30 – 2018-12-04 (×5): 500 mg via ORAL
  Filled 2018-11-30 (×5): qty 1

## 2018-11-30 MED ORDER — BUPROPION HCL ER (XL) 150 MG PO TB24
150.0000 mg | ORAL_TABLET | Freq: Every day | ORAL | Status: DC
  Administered 2018-11-30 – 2018-12-04 (×5): 150 mg via ORAL
  Filled 2018-11-30 (×6): qty 1

## 2018-11-30 MED ORDER — ENOXAPARIN SODIUM 40 MG/0.4ML ~~LOC~~ SOLN
40.0000 mg | Freq: Every day | SUBCUTANEOUS | Status: DC
  Administered 2018-11-30 – 2018-12-04 (×5): 40 mg via SUBCUTANEOUS
  Filled 2018-11-30 (×5): qty 0.4

## 2018-11-30 MED ORDER — SODIUM CHLORIDE 0.9 % IV SOLN
100.0000 mg | INTRAVENOUS | Status: AC
  Administered 2018-12-01 – 2018-12-04 (×4): 100 mg via INTRAVENOUS
  Filled 2018-11-30 (×4): qty 20

## 2018-11-30 MED ORDER — ACETAMINOPHEN 325 MG PO TABS
650.0000 mg | ORAL_TABLET | Freq: Four times a day (QID) | ORAL | Status: DC | PRN
  Administered 2018-11-30 – 2018-12-01 (×4): 650 mg via ORAL
  Filled 2018-11-30 (×4): qty 2

## 2018-11-30 MED ORDER — METHYLPREDNISOLONE SODIUM SUCC 40 MG IJ SOLR: 30.0000 mg | Freq: Two times a day (BID) | INTRAMUSCULAR | Status: DC

## 2018-11-30 MED ORDER — DM-GUAIFENESIN ER 30-600 MG PO TB12
1.0000 | ORAL_TABLET | Freq: Two times a day (BID) | ORAL | Status: DC | PRN
  Administered 2018-11-30 – 2018-12-02 (×4): 1 via ORAL
  Filled 2018-11-30 (×4): qty 1

## 2018-11-30 MED ORDER — TOCILIZUMAB 400 MG/20ML IV SOLN
8.0000 mg/kg | Freq: Once | INTRAVENOUS | Status: AC
  Administered 2018-11-30: 472 mg via INTRAVENOUS
  Filled 2018-11-30: qty 20

## 2018-11-30 MED ORDER — ONDANSETRON HCL 4 MG/2ML IJ SOLN: 4.0000 mg | Freq: Four times a day (QID) | INTRAMUSCULAR | Status: DC | PRN

## 2018-11-30 MED ORDER — METHYLPREDNISOLONE SODIUM SUCC 40 MG IJ SOLR
40.0000 mg | Freq: Two times a day (BID) | INTRAMUSCULAR | Status: DC
  Administered 2018-11-30 – 2018-12-04 (×9): 40 mg via INTRAVENOUS
  Filled 2018-11-30 (×9): qty 1

## 2018-11-30 MED ORDER — SODIUM CHLORIDE 0.9 % IV SOLN
1000.0000 mL | INTRAVENOUS | Status: DC
  Administered 2018-11-30 – 2018-12-01 (×5): 1000 mL via INTRAVENOUS

## 2018-11-30 MED ORDER — HYDRALAZINE HCL 20 MG/ML IJ SOLN: 5.0000 mg | INTRAMUSCULAR | Status: DC | PRN

## 2018-11-30 MED ORDER — IPRATROPIUM BROMIDE HFA 17 MCG/ACT IN AERS
2.0000 | INHALATION_SPRAY | RESPIRATORY_TRACT | Status: DC
  Administered 2018-11-30 – 2018-12-04 (×22): 2 via RESPIRATORY_TRACT
  Filled 2018-11-30: qty 12.9

## 2018-11-30 MED ORDER — SODIUM CHLORIDE 0.9 % IV SOLN
200.0000 mg | Freq: Once | INTRAVENOUS | Status: AC
  Administered 2018-11-30: 200 mg via INTRAVENOUS
  Filled 2018-11-30: qty 40

## 2018-11-30 MED ORDER — NICOTINE 21 MG/24HR TD PT24
21.0000 mg | MEDICATED_PATCH | Freq: Every day | TRANSDERMAL | Status: DC
  Administered 2018-11-30 – 2018-12-04 (×5): 21 mg via TRANSDERMAL
  Filled 2018-11-30 (×6): qty 1

## 2018-11-30 MED ORDER — ONDANSETRON HCL 4 MG PO TABS: 4.0000 mg | ORAL_TABLET | Freq: Four times a day (QID) | ORAL | Status: DC | PRN

## 2018-11-30 MED ORDER — ZINC SULFATE 220 (50 ZN) MG PO CAPS
220.0000 mg | ORAL_CAPSULE | Freq: Every day | ORAL | Status: DC
  Administered 2018-11-30 – 2018-12-04 (×5): 220 mg via ORAL
  Filled 2018-11-30 (×5): qty 1

## 2018-11-30 NOTE — Progress Notes (Signed)
SATURATION QUALIFICATIONS: (This note is used to comply with regulatory documentation for home oxygen)  Patient Saturations on Room Air at Rest = 90%  Patient Saturations on Room Air while Ambulating = 84%  Patient Saturations on 2 Liters of oxygen while Ambulating = 88%  Please briefly explain why patient needs home oxygen:Pt desaturates with mobility on RA.    Barbarann Ehlers Millee Denise, PT, DPT  Acute Rehabilitation [phone] pager [phone]) [phone] office  @ [facility]: [phone]

## 2018-11-30 NOTE — Progress Notes (Signed)
The pt's Daughter was called per the pt's request, but there was no answer. I will try again later.

## 2018-11-30 NOTE — Evaluation (Signed)
Physical Therapy Evaluation Patient Details Name: Nichole Hanson MRN: [id number] DOB: 1955/09/08 Today's Date: 11/30/2018   History of Present Illness  63 y.o. female admitted on 11/29/18 for cough, SOB, fever, chills.  Dx with COVID 19 PNA.  Pt with significant PMH of CKD.   Clinical Impression  Pt was able to walk around the unit with very little assist, but she did require 2 L O2 [place] to maintain sats in the upper 80s (see separate O2 sat note).  She completed seated and standing exercises in her room and I anticipate that she will do well, hopefully weaning from O2 prior to d/c.   PT to follow acutely for deficits listed below.      Follow Up Recommendations No PT follow up    Equipment Recommendations  None recommended by PT    Recommendations for Other Services   NA    Precautions / Restrictions Precautions Precautions: Other (comment) Precaution Comments: monitor O2 sats closely      Mobility  Bed Mobility Overal bed mobility: Modified Independent                Transfers Overall transfer level: Needs assistance   Transfers: Sit to/from Stand Sit to Stand: Supervision         General transfer comment: supervision for safety and line management  Ambulation/Gait Ambulation/Gait assistance: Supervision Gait Distance (Feet): 300 Feet Assistive device: None Gait Pattern/deviations: Step-through pattern;Staggering right;Staggering left Gait velocity: decreased Gait velocity interpretation: >2.62 ft/sec, indicative of community ambulatory General Gait Details: Pt with mildly staggering gait pattern, supervision for safety.  O2 sats decreased to 84%on RA during gait, and pt was 88% on 2 L O2 [place] during gait.       Balance Overall balance assessment: Needs assistance Sitting-balance support: Feet supported;No upper extremity supported Sitting balance-Leahy Scale: Good     Standing balance support: No upper extremity supported Standing balance-Leahy Scale:  Good                               Pertinent Vitals/Pain Pain Assessment: No/denies pain    Home Living Family/patient expects to be discharged to:: Private residence Living Arrangements: Children;Other relatives Available Help at Discharge: Family;Available 24 hours/day Type of Home: House Home Access: Level entry     Home Layout: One level Home Equipment: None      Prior Function Level of Independence: Independent         Comments: has taken leave from work at [employer] during this pandemic, but usually works full time.  She likes to read and walk outside.      Hand Dominance   Dominant Hand: Right    Extremity/Trunk Assessment   Upper Extremity Assessment Upper Extremity Assessment: Overall WFL for tasks assessed    Lower Extremity Assessment Lower Extremity Assessment: Generalized weakness    Cervical / Trunk Assessment Cervical / Trunk Assessment: Normal  Communication   Communication: No difficulties  Cognition Arousal/Alertness: Awake/alert Behavior During Therapy: WFL for tasks assessed/performed Overall Cognitive Status: Within Functional Limits for tasks assessed                                           Exercises General Exercises - Upper Extremity Shoulder Flexion: AROM;Both;10 reps Elbow Flexion: AROM;Both;10 reps General Exercises - Lower Extremity Long Arc Quad: AROM;Both;10 reps Hip  Flexion/Marching: AROM;Both;10 reps;Standing Toe Raises: AROM;Both;20 reps;Seated(could do standing next time.) Heel Raises: AROM;Both;20 reps;Seated(could do standing next time) Mini-Sqauts: AROM;Both;10 reps;Standing   Assessment/Plan    PT Assessment Patient needs continued PT services  PT Problem List Decreased activity tolerance;Decreased balance;Decreased mobility;Cardiopulmonary status limiting activity       PT Treatment Interventions DME instruction;Gait training;Stair training;Functional mobility  training;Therapeutic activities;Therapeutic exercise;Balance training;Patient/family education    PT Goals (Current goals can be found in the Care Plan section)  Acute Rehab PT Goals Patient Stated Goal: to improve her breathing and go home as soon as she can PT Goal Formulation: With patient Time For Goal Achievement: 12/14/18 Potential to Achieve Goals: Good    Frequency Min 3X/week    AM-PAC PT "6 Clicks" Mobility  Outcome Measure Help needed turning from your back to your side while in a flat bed without using bedrails?: None Help needed moving from lying on your back to sitting on the side of a flat bed without using bedrails?: None Help needed moving to and from a bed to a chair (including a wheelchair)?: None Help needed standing up from a chair using your arms (e.g., wheelchair or bedside chair)?: None Help needed to walk in hospital room?: None Help needed climbing 3-5 steps with a railing? : A Little 6 Click Score: 23    End of Session Equipment Utilized During Treatment: Oxygen(2 L O2 [place]) Activity Tolerance: Patient limited by fatigue Patient left: in chair;with call bell/phone within reach;with nursing/sitter in room Nurse Communication: Mobility status PT Visit Diagnosis: Muscle weakness (generalized) (M62.81);Difficulty in walking, not elsewhere classified (R26.2)    Time: 1415-1500 PT Time Calculation (min) (ACUTE ONLY): 45 min   Charges:           Wells Guiles B. Kippy Gohman, PT, DPT  Acute Rehabilitation [phone] pager [phone]) [phone] office  @ [facility]: [phone]   PT Evaluation $PT Eval Low Complexity: 1 Low PT Treatments $Gait Training: 8-22 mins $Therapeutic Exercise: 8-22 mins        11/30/2018, 3:58 PM

## 2018-11-30 NOTE — ED Notes (Signed)
[facility] called with update on ETA for transport, ticket has been sent to crew, crew getting dressed and checking truck should be here by 10/10:30

## 2018-11-30 NOTE — Progress Notes (Signed)
Pharmacy Antibiotic Note  Nichole Hanson is a 63 y.o. female admitted on 11/29/2018 with COVID 19.  Pharmacy has been consulted for Remdesivir and Actemra dosing. No contraindications noted.  CXR shows progressive multilobar PNA Pt requiring 2.5L [name] ALT 27 CRP 37.7  Plan: Remdesivir 200mg  IV now then 100mg  IV daily x 4 days Actemra 8mg /kg x 1 dose Will f/u ALT and pt's clinical condition   Height: 5\' 4"  (162.6 cm) Weight: 130 lb (59 kg) IBW/kg (Calculated) : 54.7  Temp (24hrs), Avg:99.5 F (37.5 C), Min:98.8 F (37.1 C), Max:100.7 F (38.2 C)  Recent Labs  Lab 11/29/18 2059 11/29/18 2230  WBC 8.7  --   CREATININE 1.05*  --   LATICACIDVEN 2.1* 2.8*    Estimated Creatinine Clearance: 46.1 mL/min (A) (by C-G formula based on SCr of 1.05 mg/dL (H)).    Allergies  Allergen Reactions  . Asa Arthritis Strength-Antacid [Aspirin Buffered] Other (See Comments)    Tingling on one side body-PATIENT DENIES THIS IS AN ALLERGY    Antimicrobials this admission: 7/25 Remdesivir >> 7/29 7/25 Actemra x 1  Microbiology results: 7/24 BCx: ngtd 7/24 SARs coronavirus 2: positive  Thank you for allowing pharmacy to be a part of this patient's care.  Sherlon Handing, PharmD, BCPS CGV Clinical pharmacist phone [phone] 11/30/2018 11:37 AM

## 2018-11-30 NOTE — Progress Notes (Signed)
Patient belongings: socks, candy, denture supplies and panties.

## 2018-11-30 NOTE — Progress Notes (Addendum)
PROGRESS NOTE                                                                                                                                                                                                             Patient Demographics:    Nichole Hanson, is a 63 y.o. female, DOB - [birth year], BJS:[id number]  Admit date - 11/29/2018   Admitting Physician Ivor Costa, MD  Outpatient Primary MD for the patient is Robyne Peers, MD  LOS - 0  Outpatient Specialists:None  Chief Complaint  Patient presents with  . Shortness of Breath       Brief Narrative   63 year old female with a history of asthma (only takes as needed Zyrtec), CKD stage II, tobacco abuse, hypertension presented with shortness of breath, cough, fevers and chills for almost 10 days.  Worsened on the day of admission.  No chest pain, palpitations, reported nausea but no vomiting.  No abdominal pain or diarrhea.  Reportedly her family member was tested positive for COVID recently. She went to the urgent care where she was found to have oxygen desaturation. In the ED she had temperature of 99 F, tachycardic, tachypneic, O2 sat 85% on room air, chest x-ray negative for infiltrate.  Blood work showed WBC 8.7, lactic acid 2.1, d-dimer of 0.99, CRP of 37.7, LDH of 324, procalcitonin of 0.23, ferritin of 279, ferritin of 800.  Renal function was normal.  Patient admitted to telemetry for COVID-19 respiratory infection and acute respiratory failure with hypoxia.  Subjective:   Patient maintaining sats on 2 L via nasal cannula.  Reports of breathing slightly better since presentation.  Having some nonproductive cough.   Assessment  & Plan :    Principal Problem:   Acute respiratory failure with hypoxia secondary  to COVID-19 virus Currently maintaining sats on 2 L via nasal cannula in the mid 90s.  May drop sats on ambulation. Maintain airborne and contact  precaution. IV Solu-Medrol 40 mg twice daily.  Atrovent and Xopenex inhaler as needed.  Added Mucinex for cough.  Does not need Actemra or MD severe at this time and will be assessed when patient arrives at [facility]. Follow-up daily CRP, d-dimer and ferritin. Gentle IV hydration. Patient stable to be transferred to [facility] for further management of her COVID-19 respiratory illness.  Tobacco  abuse Counseled on smoking cessation.  Nicotine patch  Essential hypertension Stable.  Holding HCTZ.  Chronic depression Continue Wellbutrin  CKD stage II Stable.   Patient status: Inpatient Patient presenting with acute respiratory failure with hypoxia with tachycardia and tachypnea in the setting of COVID-19 infection with elevated inflammatory markers.  Patient is at high risk of developing worsening of her acute respiratory failure and also developing sepsis secondary to COVID-19 infection.  Based on her current symptoms she needs to be monitored closely in an inpatient setting for >2 midnights.    Code Status : None  Family Communication none  Disposition Plan  : Transfer to [place]  : None  Procedures  : None  DVT Prophylaxis  :  Lovenox -  Lab Results  Component Value Date   PLT 381 11/29/2018    Antibiotics  :   Anti-infectives (From admission, onward)   None        Objective:   Vitals:   11/30/18 0800 11/30/18 0815 11/30/18 0830 11/30/18 0845  BP: 123/74 120/73 114/70 106/76  Pulse: 77 77 79 76  Resp: (!) 32 (!) 31 (!) 28 (!) 23  Temp:      TempSrc:      SpO2: 93% 96% 95% 95%  Weight:      Height:        Wt Readings from Last 3 Encounters:  11/29/18 59 kg  08/04/13 59 kg  08/15/11 59.1 kg    No intake or output data in the 24 hours ending 11/30/18 0929   Physical Exam  Gen: not in distress, fatigued HEENT: no pallor, moist mucosa, supple neck Chest: Few scattered rhonchi CVS: N S1&S2, no murmurs,  rubs or gallop GI: soft, NT, ND,  Musculoskeletal: warm, no edema     Data Review:    CBC Recent Labs  Lab 11/29/18 2059  WBC 8.7  HGB 11.3*  HCT 34.5*  PLT 381  MCV 79.7*  MCH 26.1  MCHC 32.8  RDW 13.2  LYMPHSABS 1.0  MONOABS 0.5  EOSABS 0.0  BASOSABS 0.0    Chemistries  Recent Labs  Lab 11/29/18 2059  NA 134*  K 3.8  CL 91*  CO2 26  GLUCOSE 130*  BUN 10  CREATININE 1.05*  CALCIUM 9.0  AST 43*  ALT 27  ALKPHOS 95  BILITOT 1.1   ------------------------------------------------------------------------------------------------------------------ Recent Labs    11/29/18 2059  TRIG 122    No results found for: HGBA1C ------------------------------------------------------------------------------------------------------------------ No results for input(s): TSH, T4TOTAL, T3FREE, THYROIDAB in the last 72 hours.  Invalid input(s): FREET3 ------------------------------------------------------------------------------------------------------------------ Recent Labs    11/29/18 2059  FERRITIN 279    Coagulation profile No results for input(s): INR, PROTIME in the last 168 hours.  Recent Labs    11/29/18 2059  DDIMER 0.99*    Cardiac Enzymes No results for input(s): CKMB, TROPONINI, MYOGLOBIN in the last 168 hours.  Invalid input(s): CK ------------------------------------------------------------------------------------------------------------------ No results found for: BNP  Inpatient Medications  Scheduled Meds: . methylPREDNISolone (SOLU-MEDROL) injection  40 mg Intravenous Q12H   Continuous Infusions: . sodium chloride 1,000 mL (11/30/18 0259)   PRN Meds:.acetaminophen, hydrALAZINE  Micro Results Recent Results (from the past 240 hour(s))  SARS Coronavirus 2 [facility] order, Performed in [place] hospital lab)     Status: Abnormal   Collection Time: 11/29/18 10:08 PM   Specimen: Nasopharyngeal Swab  Result Value Ref Range Status    SARS Coronavirus 2 POSITIVE (A) NEGATIVE Final  Comment: RESULT CALLED TO, READ BACK BY AND VERIFIED WITH: Bennie Hind RN 11/29/18 2323 JDW (NOTE) If result is NEGATIVE SARS-CoV-2 target nucleic acids are NOT DETECTED. The SARS-CoV-2 RNA is generally detectable in upper and lower  respiratory specimens during the acute phase of infection. The lowest  concentration of SARS-CoV-2 viral copies this assay can detect is 250  copies / mL. A negative result does not preclude SARS-CoV-2 infection  and should not be used as the sole basis for treatment or other  patient management decisions.  A negative result may occur with  improper specimen collection / handling, submission of specimen other  than nasopharyngeal swab, presence of viral mutation(s) within the  areas targeted by this assay, and inadequate number of viral copies  (<250 copies / mL). A negative result must be combined with clinical  observations, patient history, and epidemiological information. If result is POSITIVE SARS-CoV-2 target nucleic acids are DETECTED. The SAR S-CoV-2 RNA is generally detectable in upper and lower  respiratory specimens during the acute phase of infection.  Positive  results are indicative of active infection with SARS-CoV-2.  Clinical  correlation with patient history and other diagnostic information is  necessary to determine patient infection status.  Positive results do  not rule out bacterial infection or co-infection with other viruses. If result is PRESUMPTIVE POSTIVE SARS-CoV-2 nucleic acids MAY BE PRESENT.   A presumptive positive result was obtained on the submitted specimen  and confirmed on repeat testing.  While 2019 novel coronavirus  (SARS-CoV-2) nucleic acids may be present in the submitted sample  additional confirmatory testing may be necessary for epidemiological  and / or clinical management purposes  to differentiate between  SARS-CoV-2 and other Sarbecovirus currently known to  infect humans.  If clinically indicated additional testing with an alternate test  methodology [phone]) is advis ed. The SARS-CoV-2 RNA is generally  detectable in upper and lower respiratory specimens during the acute  phase of infection. The expected result is Negative. Fact Sheet for Patients:  StrictlyIdeas.no Fact Sheet for Healthcare Providers: BankingDealers.co.za This test is not yet approved or cleared by the [place] FDA and has been authorized for detection and/or diagnosis of SARS-CoV-2 by FDA under an Emergency Use Authorization (EUA).  This EUA will remain in effect (meaning this test can be used) for the duration of the COVID-19 declaration under Section 564(b)(1) of the Act, 21 U.S.C. section 360bbb-3(b)(1), unless the authorization is terminated or revoked sooner. Performed at [facility], [place] [address], [place], [place] [address]   Blood Culture (routine x 2)     Status: None (Preliminary result)   Collection Time: 11/29/18 10:20 PM   Specimen: BLOOD RIGHT HAND  Result Value Ref Range Status   Specimen Description BLOOD RIGHT HAND  Final   Special Requests   Final    BOTTLES DRAWN AEROBIC AND ANAEROBIC Blood Culture adequate volume   Culture   Final    NO GROWTH < 12 HOURS Performed at [facility], [place] [address], [place], [place] [address]    Report Status PENDING  Incomplete  Blood Culture (routine x 2)     Status: None (Preliminary result)   Collection Time: 11/29/18 10:31 PM   Specimen: BLOOD LEFT HAND  Result Value Ref Range Status   Specimen Description BLOOD LEFT HAND  Final   Special Requests   Final    BOTTLES DRAWN AEROBIC ONLY Blood Culture results may not be optimal due to an inadequate  volume of blood received in culture bottles   Culture   Final    NO GROWTH < 12 HOURS Performed at [facility], [place] [address], [place], [place] [address]    Report Status PENDING   Incomplete    Radiology Reports Dg Chest Port 1 View  Result Date: 11/30/2018 CLINICAL DATA:  63 year old female with history of shortness of breath. Multiple family contacts positive for COVID-19. EXAM: PORTABLE CHEST 1 VIEW COMPARISON:  Chest x-ray 11/29/2018. FINDINGS: Lung volumes are low. Diffuse peribronchial cuffing. Widespread areas of ground-glass attenuation most evident throughout the mid to lower lungs bilaterally (right greater than left) with some associated septal thickening, increased compared to the prior examination. No definite pleural effusions. No evidence of pulmonary edema. Heart size is normal. The patient is rotated to the left on today's exam, resulting in distortion of the mediastinal contours and reduced diagnostic sensitivity and specificity for mediastinal pathology. IMPRESSION: 1. The appearance the chest is concerning for progressive multilobar pneumonia, as above. Electronically Signed   By: Vinnie Langton M.D.   On: 11/30/2018 08:18   Dg Chest Portable 1 View  Result Date: 11/29/2018 CLINICAL DATA:  63 year old female with shortness of breath. EXAM: PORTABLE CHEST 1 VIEW COMPARISON:  None. FINDINGS: Background of emphysema and chronic interstitial coarsening. Left lung base linear atelectasis/scarring. No focal consolidation, pleural effusion, or pneumothorax. The cardiac silhouette is within normal limits. No acute osseous pathology. IMPRESSION: No active disease. Electronically Signed   By: Anner Crete M.D.   On: 11/29/2018 22:15    Time Spent in minutes 35   Xaviera Flaten M.D on 11/30/2018 at 9:29 AM  Between 7am to 7pm - Pager - [phone]  After 7pm go to [email or web address] - password [email or web address]  [facility] -  Office  [phone]

## 2018-11-30 NOTE — Progress Notes (Signed)
Notified daughter, Holley Raring [phone]) of progress.  All questions were answered and this nurse's contact number shared for further communication.

## 2018-11-30 NOTE — ED Notes (Signed)
[facility] called at [facility] to check on ETA for transport to [facility], was advised they would hopefully be here within the hour

## 2018-11-30 NOTE — Progress Notes (Signed)
PROGRESS NOTE                                                                                                                                                                                                             Patient Demographics:    Nichole Hanson, is a 63 y.o. female, DOB - 04/10/1956, MCE:[id number]  Outpatient Primary MD for the patient is Robyne Peers, MD    LOS - 0  Admit date - 11/29/2018    Chief Complaint  Patient presents with  . Shortness of Breath       Brief Narrative  63 year old female with a history of asthma (only takes as needed Zyrtec), CKD stage II, tobacco abuse, hypertension presented with shortness of breath, cough, fevers and chills for almost 10 days.  Worsened on the day of admission.  No chest pain, palpitations, reported nausea but no vomiting.  No abdominal pain or diarrhea.  Reportedly her family member was tested positive for COVID recently.  In the ER she was diagnosed with COVID-19 infection and admitted.    Subjective:    [name] today has, No headache, No chest pain, No abdominal pain - No Nausea, No new weakness tingling or numbness, mild Cough - SOB.     Assessment  & Plan :     1. Acute Hypoxic Resp. Failure due to Acute Covid 19 Viral Pneumonitis during the ongoing 2020 Covid 19 Pandemic - initially patient was on room air now requiring 3 L nasal cannula oxygen, chest x-ray has progressed from unremarkable to bibasilar interstitial pattern, CRP is climbing up and currently 37, on IV steroids which will be continued will add Remdisvir and Actemra on 11/30/2018.  Patient was given information on off label use of Actemra, she agrees to the use.  Understands risks and benefits, no previous TB or hepatitis history.  Continue to monitor inflammatory markers and clinically, encouraged to sit up in chair use flutter valve and I-S for pulmonary toiletry in the daytime and prone at  night.     ABG  No results found for: PHART, PCO2ART, PO2ART, HCO3, TCO2, ACIDBASEDEF, O2SAT  COVID-19 Labs  Recent Labs    11/29/18 2059  DDIMER 0.99*  FERRITIN 279  LDH 324*  CRP 37.7*    Lab Results  Component Value Date   SARSCOV2NAA POSITIVE (A) 11/29/2018  Hepatic Function Latest Ref Rng & Units 11/29/2018 07/06/2011  Total Protein 6.5 - 8.1 g/dL 7.5 6.7  Albumin 3.5 - 5.0 g/dL 2.9(L) 3.8  AST 15 - 41 U/L 43(H) 16  ALT 0 - 44 U/L 27 12  Alk Phosphatase 38 - 126 U/L 95 83  Total Bilirubin 0.3 - 1.2 mg/dL 1.1 0.2(L)     No results found for: BNP    2.  Smoking.  Counseled to quit.  On NicoDerm patch.  3.  Hypertension.  Blood pressure stable HCTZ on hold will add low-dose IV hydralazine as needed.  4.  History of chronic depression.  Stable on Wellbutrin.  5.  CKD 2.  At baseline.      Condition - Extremely Guarded  Family Communication  :  None  Code Status :  Full  Diet :   Diet Order            Diet Heart Room service appropriate? Yes; Fluid consistency: Thin  Diet effective now               Disposition Plan  :  TBD  Consults  :    Procedures  :     PUD Prophylaxis :    DVT Prophylaxis  :  Lovenox    Lab Results  Component Value Date   PLT 381 11/29/2018    Inpatient Medications  Scheduled Meds: . buPROPion  150 mg Oral Daily  . enoxaparin (LOVENOX) injection  40 mg Subcutaneous Daily  . ipratropium  2 puff Inhalation Q4H  . methylPREDNISolone (SOLU-MEDROL) injection  40 mg Intravenous Q12H  . nicotine  21 mg Transdermal Daily  . vitamin C  500 mg Oral Daily  . zinc sulfate  220 mg Oral Daily   Continuous Infusions: . sodium chloride 1,000 mL (11/30/18 0259)   PRN Meds:.acetaminophen, dextromethorphan-guaiFENesin, hydrALAZINE, levalbuterol, ondansetron **OR** ondansetron (ZOFRAN) IV  Antibiotics  :    Anti-infectives (From admission, onward)   None       Time Spent in minutes  30   Lala Lund M.D on  11/30/2018 at 10:52 AM  To page go to [email or web address] - password [facility]  [facility] -  Office  [phone]   See all Orders from today for further details    Objective:   Vitals:   11/30/18 0915 11/30/18 0945 11/30/18 1028 11/30/18 1029  BP: 125/81 121/76  (!) 111/52  Pulse: 85   87  Resp: (!) 30 (!) 36  20  Temp:   (!) 100.7 F (38.2 C)   TempSrc:   Oral   SpO2: 94%   93%  Weight:      Height:        Wt Readings from Last 3 Encounters:  11/29/18 59 kg  08/04/13 59 kg  08/15/11 59.1 kg    No intake or output data in the 24 hours ending 11/30/18 1052   Physical Exam  Tele Visit    Data Review:    CBC Recent Labs  Lab 11/29/18 2059  WBC 8.7  HGB 11.3*  HCT 34.5*  PLT 381  MCV 79.7*  MCH 26.1  MCHC 32.8  RDW 13.2  LYMPHSABS 1.0  MONOABS 0.5  EOSABS 0.0  BASOSABS 0.0    Chemistries  Recent Labs  Lab 11/29/18 2059  NA 134*  K 3.8  CL 91*  CO2 26  GLUCOSE 130*  BUN 10  CREATININE 1.05*  CALCIUM 9.0  AST 43*  ALT 27  ALKPHOS 95  BILITOT 1.1   ------------------------------------------------------------------------------------------------------------------ Recent Labs    11/29/18 2059  TRIG 122    No results found for: HGBA1C ------------------------------------------------------------------------------------------------------------------ No results for input(s): TSH, T4TOTAL, T3FREE, THYROIDAB in the last 72 hours.  Invalid input(s): FREET3  Cardiac Enzymes No results for input(s): CKMB, TROPONINI, MYOGLOBIN in the last 168 hours.  Invalid input(s): CK ------------------------------------------------------------------------------------------------------------------ No results found for: BNP  Micro Results Recent Results (from the past 240 hour(s))  SARS Coronavirus 2 [facility] order, Performed in [place] hospital lab)     Status: Abnormal   Collection Time: 11/29/18 10:08 PM   Specimen: Nasopharyngeal Swab  Result  Value Ref Range Status   SARS Coronavirus 2 POSITIVE (A) NEGATIVE Final    Comment: RESULT CALLED TO, READ BACK BY AND VERIFIED WITH: Bennie Hind RN 11/29/18 2323 JDW (NOTE) If result is NEGATIVE SARS-CoV-2 target nucleic acids are NOT DETECTED. The SARS-CoV-2 RNA is generally detectable in upper and lower  respiratory specimens during the acute phase of infection. The lowest  concentration of SARS-CoV-2 viral copies this assay can detect is 250  copies / mL. A negative result does not preclude SARS-CoV-2 infection  and should not be used as the sole basis for treatment or other  patient management decisions.  A negative result may occur with  improper specimen collection / handling, submission of specimen other  than nasopharyngeal swab, presence of viral mutation(s) within the  areas targeted by this assay, and inadequate number of viral copies  (<250 copies / mL). A negative result must be combined with clinical  observations, patient history, and epidemiological information. If result is POSITIVE SARS-CoV-2 target nucleic acids are DETECTED. The SAR S-CoV-2 RNA is generally detectable in upper and lower  respiratory specimens during the acute phase of infection.  Positive  results are indicative of active infection with SARS-CoV-2.  Clinical  correlation with patient history and other diagnostic information is  necessary to determine patient infection status.  Positive results do  not rule out bacterial infection or co-infection with other viruses. If result is PRESUMPTIVE POSTIVE SARS-CoV-2 nucleic acids MAY BE PRESENT.   A presumptive positive result was obtained on the submitted specimen  and confirmed on repeat testing.  While 2019 novel coronavirus  (SARS-CoV-2) nucleic acids may be present in the submitted sample  additional confirmatory testing may be necessary for epidemiological  and / or clinical management purposes  to differentiate between  SARS-CoV-2 and other  Sarbecovirus currently known to infect humans.  If clinically indicated additional testing with an alternate test  methodology [phone]) is advis ed. The SARS-CoV-2 RNA is generally  detectable in upper and lower respiratory specimens during the acute  phase of infection. The expected result is Negative. Fact Sheet for Patients:  StrictlyIdeas.no Fact Sheet for Healthcare Providers: BankingDealers.co.za This test is not yet approved or cleared by the [place] FDA and has been authorized for detection and/or diagnosis of SARS-CoV-2 by FDA under an Emergency Use Authorization (EUA).  This EUA will remain in effect (meaning this test can be used) for the duration of the COVID-19 declaration under Section 564(b)(1) of the Act, 21 U.S.C. section 360bbb-3(b)(1), unless the authorization is terminated or revoked sooner. Performed at [facility], [place] [address], [place], [place] [address]   Blood Culture (routine x 2)     Status: None (Preliminary result)   Collection Time: 11/29/18 10:20 PM   Specimen: BLOOD RIGHT HAND  Result Value Ref Range Status   Specimen Description BLOOD  RIGHT HAND  Final   Special Requests   Final    BOTTLES DRAWN AEROBIC AND ANAEROBIC Blood Culture adequate volume   Culture   Final    NO GROWTH < 12 HOURS Performed at [facility], [address] [place], [place] [address]    Report Status PENDING  Incomplete  Blood Culture (routine x 2)     Status: None (Preliminary result)   Collection Time: 11/29/18 10:31 PM   Specimen: BLOOD LEFT HAND  Result Value Ref Range Status   Specimen Description BLOOD LEFT HAND  Final   Special Requests   Final    BOTTLES DRAWN AEROBIC ONLY Blood Culture results may not be optimal due to an inadequate volume of blood received in culture bottles   Culture   Final    NO GROWTH < 12 HOURS Performed at [facility], [place] [address] [place], [place] [address]     Report Status PENDING  Incomplete    Radiology Reports Dg Chest Port 1 View  Result Date: 11/30/2018 CLINICAL DATA:  63 year old female with history of shortness of breath. Multiple family contacts positive for COVID-19. EXAM: PORTABLE CHEST 1 VIEW COMPARISON:  Chest x-ray 11/29/2018. FINDINGS: Lung volumes are low. Diffuse peribronchial cuffing. Widespread areas of ground-glass attenuation most evident throughout the mid to lower lungs bilaterally (right greater than left) with some associated septal thickening, increased compared to the prior examination. No definite pleural effusions. No evidence of pulmonary edema. Heart size is normal. The patient is rotated to the left on today's exam, resulting in distortion of the mediastinal contours and reduced diagnostic sensitivity and specificity for mediastinal pathology. IMPRESSION: 1. The appearance the chest is concerning for progressive multilobar pneumonia, as above. Electronically Signed   By: Vinnie Langton M.D.   On: 11/30/2018 08:18   Dg Chest Portable 1 View  Result Date: 11/29/2018 CLINICAL DATA:  63 year old female with shortness of breath. EXAM: PORTABLE CHEST 1 VIEW COMPARISON:  None. FINDINGS: Background of emphysema and chronic interstitial coarsening. Left lung base linear atelectasis/scarring. No focal consolidation, pleural effusion, or pneumothorax. The cardiac silhouette is within normal limits. No acute osseous pathology. IMPRESSION: No active disease. Electronically Signed   By: Anner Crete M.D.   On: 11/29/2018 22:15

## 2018-11-30 NOTE — Progress Notes (Signed)
The pt's Daughter was called and provided with a daily update regarding the pt's current status. Any questions or concerns were addressed.

## 2018-12-01 DIAGNOSIS — I10 Essential (primary) hypertension: Secondary | ICD-10-CM

## 2018-12-01 LAB — COMPREHENSIVE METABOLIC PANEL
ALT: 24 U/L (ref 0–44)
AST: 37 U/L (ref 15–41)
Albumin: 2.5 g/dL — ABNORMAL LOW (ref 3.5–5.0)
Alkaline Phosphatase: 88 U/L (ref 38–126)
Anion gap: 12 (ref 5–15)
BUN: 15 mg/dL (ref 8–23)
CO2: 25 mmol/L (ref 22–32)
Calcium: 8.5 mg/dL — ABNORMAL LOW (ref 8.9–10.3)
Chloride: 103 mmol/L (ref 98–111)
Creatinine, Ser: 0.66 mg/dL (ref 0.44–1.00)
GFR calc Af Amer: >60 mL/min (ref >60)
GFR calc non Af Amer: >60 mL/min (ref >60)
Glucose, Bld: 129 mg/dL — ABNORMAL HIGH (ref 70–99)
Potassium: 3.1 mmol/L — ABNORMAL LOW (ref 3.5–5.1)
Sodium: 140 mmol/L (ref 135–145)
Total Bilirubin: 0.7 mg/dL (ref 0.3–1.2)
Total Protein: 6.8 g/dL (ref 6.5–8.1)

## 2018-12-01 LAB — LACTATE DEHYDROGENASE: LDH: 301 U/L — ABNORMAL HIGH (ref 98–192)

## 2018-12-01 LAB — CBC WITH DIFFERENTIAL/PLATELET
Abs Immature Granulocytes: 0.08 10*3/uL — ABNORMAL HIGH (ref 0.00–0.07)
Basophils Absolute: 0 10*3/uL (ref 0.0–0.1)
Basophils Relative: 0 %
Eosinophils Absolute: 0 10*3/uL (ref 0.0–0.5)
Eosinophils Relative: 0 %
HCT: 32 % — ABNORMAL LOW (ref 36.0–46.0)
Hemoglobin: 10.1 g/dL — ABNORMAL LOW (ref 12.0–15.0)
Immature Granulocytes: 1 %
Lymphocytes Relative: 9 %
Lymphs Abs: 0.6 10*3/uL — ABNORMAL LOW (ref 0.7–4.0)
MCH: 24.9 pg — ABNORMAL LOW (ref 26.0–34.0)
MCHC: 31.6 g/dL (ref 30.0–36.0)
MCV: 79 fL — ABNORMAL LOW (ref 80.0–100.0)
Monocytes Absolute: 0.2 10*3/uL (ref 0.1–1.0)
Monocytes Relative: 3 %
Neutro Abs: 5.8 10*3/uL (ref 1.7–7.7)
Neutrophils Relative %: 87 %
Platelets: 456 10*3/uL — ABNORMAL HIGH (ref 150–400)
RBC: 4.05 MIL/uL (ref 3.87–5.11)
RDW: 13.3 % (ref 11.5–15.5)
WBC: 6.7 10*3/uL (ref 4.0–10.5)
nRBC: 0.6 % — ABNORMAL HIGH (ref 0.0–0.2)

## 2018-12-01 LAB — BRAIN NATRIURETIC PEPTIDE: B Natriuretic Peptide: 66.3 pg/mL (ref 0.0–100.0)

## 2018-12-01 LAB — ABO/RH: ABO/RH(D): A POS

## 2018-12-01 LAB — FERRITIN: Ferritin: 307 ng/mL (ref 11–307)

## 2018-12-01 LAB — C-REACTIVE PROTEIN: CRP: 35.9 mg/dL — ABNORMAL HIGH (ref <1.0)

## 2018-12-01 LAB — MAGNESIUM: Magnesium: 2.3 mg/dL (ref 1.7–2.4)

## 2018-12-01 LAB — D-DIMER, QUANTITATIVE: D-Dimer, Quant: 1.88 ug/mL-FEU — ABNORMAL HIGH (ref 0.00–0.50)

## 2018-12-01 MED ORDER — POTASSIUM CHLORIDE CRYS ER 20 MEQ PO TBCR
40.0000 meq | EXTENDED_RELEASE_TABLET | Freq: Once | ORAL | Status: AC
  Administered 2018-12-01: 09:00:00 40 meq via ORAL
  Filled 2018-12-01: qty 2

## 2018-12-01 MED ORDER — POTASSIUM CHLORIDE CRYS ER 20 MEQ PO TBCR
20.0000 meq | EXTENDED_RELEASE_TABLET | Freq: Once | ORAL | Status: AC
  Administered 2018-12-01: 17:00:00 20 meq via ORAL
  Filled 2018-12-01: qty 1

## 2018-12-01 MED ORDER — DIPHENHYDRAMINE HCL 25 MG PO CAPS
25.0000 mg | ORAL_CAPSULE | Freq: Every evening | ORAL | Status: DC | PRN
  Administered 2018-12-01 – 2018-12-02 (×2): 25 mg via ORAL
  Filled 2018-12-01 (×2): qty 1

## 2018-12-01 MED ORDER — TRAZODONE HCL 50 MG PO TABS
50.0000 mg | ORAL_TABLET | Freq: Every day | ORAL | Status: DC
  Administered 2018-12-01 – 2018-12-03 (×3): 50 mg via ORAL
  Filled 2018-12-01 (×3): qty 1

## 2018-12-01 NOTE — Progress Notes (Signed)
The pt's Daughter was provided with a daily update regarding the pt's current status. Any questions or concern were addressed.

## 2018-12-01 NOTE — Progress Notes (Signed)
   12/01/18 2040 12/01/18 2044  Oxygen Therapy  SpO2 (!) 86 % 90 %  O2 Device Nasal Cannula Nasal Cannula  O2 Flow Rate (L/min) 2 L/min 4 L/min    Increased oxygen to 4L nasal cannula, patient desats while talking and moving in bed.

## 2018-12-01 NOTE — Progress Notes (Signed)
PROGRESS NOTE                                                                                                                                                                                                             Patient Demographics:    Nichole Hanson, is a 63 y.o. female, DOB - [birth year], YFR:[id number]  Outpatient Primary MD for the patient is Robyne Peers, MD    LOS - 1  Admit date - 11/29/2018    Chief Complaint  Patient presents with  . Shortness of Breath       Brief Narrative  63 year old female with a history of asthma (only takes as needed Zyrtec), CKD stage II, tobacco abuse, hypertension presented with shortness of breath, cough, fevers and chills for almost 10 days.  Worsened on the day of admission.  No chest pain, palpitations, reported nausea but no vomiting.  No abdominal pain or diarrhea.  Reportedly her family member was tested positive for COVID recently.  In the ER she was diagnosed with COVID-19 infection and admitted.    Subjective:    Patient in bed, appears comfortable, denies any headache, no fever, no chest pain or pressure, improved shortness of breath , no abdominal pain. No focal weakness.    Assessment  & Plan :     1. Acute Hypoxic Resp. Failure due to Acute Covid 19 Viral Pneumonitis during the ongoing 2020 Covid 19 Pandemic - initially patient was on room air, she subsequently developed more hypoxia and required 3 L nasal cannula oxygen, chest x-ray progressed from stable to bilateral bibasilar infiltrates, she was also febrile and CRP was high.  She was started on appropriate treatment immediately which included IV steroids, Remdisvir and a dose of Actemra on the day of admission.  She is now feeling much better, less hypoxic, down to 1 L nasal cannula oxygen.  She has been encouraged to sit up in chair in the daytime use flutter valve and I-S for pulmonary toiletry and then prone at  night in bed.  Continue to monitor clinically.  Note patient had consented on the off label use of Actemra kindly see the note from 11/30/2018.  Continue to monitor inflammatory markers and clinically, encouraged to sit up in chair use flutter valve and I-S for pulmonary toiletry in the daytime and prone at night.  COVID-19 Labs  Recent Labs    11/29/18 2059 12/01/18 0319  DDIMER 0.99* 1.88*  FERRITIN 279 307  LDH 324* 301*  CRP 37.7* 35.9*    Lab Results  Component Value Date   SARSCOV2NAA POSITIVE (A) 11/29/2018     Hepatic Function Latest Ref Rng & Units 12/01/2018 11/29/2018 07/06/2011  Total Protein 6.5 - 8.1 g/dL 6.8 7.5 6.7  Albumin 3.5 - 5.0 g/dL 2.5(L) 2.9(L) 3.8  AST 15 - 41 U/L 37 43(H) 16  ALT 0 - 44 U/L '24 27 12  ' Alk Phosphatase 38 - 126 U/L 88 95 83  Total Bilirubin 0.3 - 1.2 mg/dL 0.7 1.1 0.2(L)        Component Value Date/Time   BNP 66.3 12/01/2018 0319      2.  Smoking.  Counseled to quit.  On NicoDerm patch.  3.  Hypertension.  Blood pressure stable HCTZ on hold will add low-dose IV hydralazine as needed.  4.  History of chronic depression.  Stable on Wellbutrin.  5.  CKD 2.  At baseline.  6.  Hypokalemia.  Replaced.    Condition -  Guarded  Family Communication  :  None  Code Status :  Full  Diet :   Diet Order            Diet Heart Room service appropriate? Yes; Fluid consistency: Thin  Diet effective now               Disposition Plan  :  TBD  Consults  :  None  Procedures  :     PUD Prophylaxis :  None  DVT Prophylaxis  :  Lovenox    Lab Results  Component Value Date   PLT 456 (H) 12/01/2018    Inpatient Medications  Scheduled Meds: . buPROPion  150 mg Oral Daily  . enoxaparin (LOVENOX) injection  40 mg Subcutaneous Daily  . ipratropium  2 puff Inhalation Q4H  . methylPREDNISolone (SOLU-MEDROL) injection  40 mg Intravenous Q12H  . nicotine  21 mg Transdermal Daily  . traZODone  50 mg Oral QHS  . vitamin C   500 mg Oral Daily  . zinc sulfate  220 mg Oral Daily   Continuous Infusions: . sodium chloride 1,000 mL (11/30/18 1455)  . remdesivir 100 mg in NS 250 mL     PRN Meds:.acetaminophen, dextromethorphan-guaiFENesin, hydrALAZINE, levalbuterol, ondansetron **OR** ondansetron (ZOFRAN) IV  Antibiotics  :    Anti-infectives (From admission, onward)   Start     Dose/Rate Route Frequency Ordered Stop   12/01/18 1200  remdesivir 100 mg in sodium chloride 0.9 % 250 mL IVPB     100 mg 500 mL/hr over 30 Minutes Intravenous Every 24 hours 11/30/18 1100 12/05/18 1159   11/30/18 1200  remdesivir 200 mg in sodium chloride 0.9 % 250 mL IVPB     200 mg 500 mL/hr over 30 Minutes Intravenous Once 11/30/18 1100 11/30/18 1300       Time Spent in minutes  30   Lala Lund M.D on 12/01/2018 at 9:00 AM  To page go to [email or web address] - password [facility]  [facility] -  Office  [phone]   See all Orders from today for further details    Objective:   Vitals:   11/30/18 2030 12/01/18 0120 12/01/18 0500 12/01/18 0535  BP: 110/69 122/71 (!) 102/58 (!) 102/59  Pulse: 91 85  76  Resp:  20  (!) 21  Temp: 98.3 F (36.8 C) 97.8 F (36.6  C) 98.8 F (37.1 C)   TempSrc: Oral Oral Oral   SpO2: 93% 96%  96%  Weight:      Height:        Wt Readings from Last 3 Encounters:  11/29/18 59 kg  08/04/13 59 kg  08/15/11 59.1 kg     Intake/Output Summary (Last 24 hours) at 12/01/2018 0900 Last data filed at 12/01/2018 0400 Gross per 24 hour  Intake 1917.16 ml  Output -  Net 1917.16 ml     Physical Exam  Awake Alert, Oriented X 3, No new F.N deficits, Normal affect [place].AT,PERRAL Supple Neck,No JVD, No cervical lymphadenopathy appriciated.  Symmetrical Chest wall movement, Good air movement bilaterally, CTAB RRR,No Gallops, Rubs or new Murmurs, No Parasternal Heave +ve B.Sounds, Abd Soft, No tenderness, No organomegaly appriciated, No rebound - guarding or rigidity. No Cyanosis, Clubbing or  edema, No new Rash or bruise    Data Review:    CBC Recent Labs  Lab 11/29/18 2059 12/01/18 0319  WBC 8.7 6.7  HGB 11.3* 10.1*  HCT 34.5* 32.0*  PLT 381 456*  MCV 79.7* 79.0*  MCH 26.1 24.9*  MCHC 32.8 31.6  RDW 13.2 13.3  LYMPHSABS 1.0 0.6*  MONOABS 0.5 0.2  EOSABS 0.0 0.0  BASOSABS 0.0 0.0    Chemistries  Recent Labs  Lab 11/29/18 2059 12/01/18 0319  NA 134* 140  K 3.8 3.1*  CL 91* 103  CO2 26 25  GLUCOSE 130* 129*  BUN 10 15  CREATININE 1.05* 0.66  CALCIUM 9.0 8.5*  MG  --  2.3  AST 43* 37  ALT 27 24  ALKPHOS 95 88  BILITOT 1.1 0.7   ------------------------------------------------------------------------------------------------------------------ Recent Labs    11/29/18 2059  TRIG 122    No results found for: HGBA1C ------------------------------------------------------------------------------------------------------------------ No results for input(s): TSH, T4TOTAL, T3FREE, THYROIDAB in the last 72 hours.  Invalid input(s): FREET3  Cardiac Enzymes No results for input(s): CKMB, TROPONINI, MYOGLOBIN in the last 168 hours.  Invalid input(s): CK ------------------------------------------------------------------------------------------------------------------    Component Value Date/Time   BNP 66.3 12/01/2018 0319    Micro Results Recent Results (from the past 240 hour(s))  SARS Coronavirus 2 [facility] order, Performed in [place] hospital lab)     Status: Abnormal   Collection Time: 11/29/18 10:08 PM   Specimen: Nasopharyngeal Swab  Result Value Ref Range Status   SARS Coronavirus 2 POSITIVE (A) NEGATIVE Final    Comment: RESULT CALLED TO, READ BACK BY AND VERIFIED WITH: Bennie Hind RN 11/29/18 2323 JDW (NOTE) If result is NEGATIVE SARS-CoV-2 target nucleic acids are NOT DETECTED. The SARS-CoV-2 RNA is generally detectable in upper and lower  respiratory specimens during the acute phase of infection. The lowest  concentration of  SARS-CoV-2 viral copies this assay can detect is 250  copies / mL. A negative result does not preclude SARS-CoV-2 infection  and should not be used as the sole basis for treatment or other  patient management decisions.  A negative result may occur with  improper specimen collection / handling, submission of specimen other  than nasopharyngeal swab, presence of viral mutation(s) within the  areas targeted by this assay, and inadequate number of viral copies  (<250 copies / mL). A negative result must be combined with clinical  observations, patient history, and epidemiological information. If result is POSITIVE SARS-CoV-2 target nucleic acids are DETECTED. The SAR S-CoV-2 RNA is generally detectable in upper and lower  respiratory specimens during the acute phase of infection.  Positive  results are indicative of active infection with SARS-CoV-2.  Clinical  correlation with patient history and other diagnostic information is  necessary to determine patient infection status.  Positive results do  not rule out bacterial infection or co-infection with other viruses. If result is PRESUMPTIVE POSTIVE SARS-CoV-2 nucleic acids MAY BE PRESENT.   A presumptive positive result was obtained on the submitted specimen  and confirmed on repeat testing.  While 2019 novel coronavirus  (SARS-CoV-2) nucleic acids may be present in the submitted sample  additional confirmatory testing may be necessary for epidemiological  and / or clinical management purposes  to differentiate between  SARS-CoV-2 and other Sarbecovirus currently known to infect humans.  If clinically indicated additional testing with an alternate test  methodology [phone]) is advis ed. The SARS-CoV-2 RNA is generally  detectable in upper and lower respiratory specimens during the acute  phase of infection. The expected result is Negative. Fact Sheet for Patients:  StrictlyIdeas.no Fact Sheet for Healthcare  Providers: BankingDealers.co.za This test is not yet approved or cleared by the [place] FDA and has been authorized for detection and/or diagnosis of SARS-CoV-2 by FDA under an Emergency Use Authorization (EUA).  This EUA will remain in effect (meaning this test can be used) for the duration of the COVID-19 declaration under Section 564(b)(1) of the Act, 21 U.S.C. section 360bbb-3(b)(1), unless the authorization is terminated or revoked sooner. Performed at [facility], [place] [address], [place], [place] [address]   Blood Culture (routine x 2)     Status: None (Preliminary result)   Collection Time: 11/29/18 10:20 PM   Specimen: BLOOD RIGHT HAND  Result Value Ref Range Status   Specimen Description BLOOD RIGHT HAND  Final   Special Requests   Final    BOTTLES DRAWN AEROBIC AND ANAEROBIC Blood Culture adequate volume   Culture   Final    NO GROWTH 2 DAYS Performed at [facility], [place] [address] [place], [place] [address]    Report Status PENDING  Incomplete  Blood Culture (routine x 2)     Status: None (Preliminary result)   Collection Time: 11/29/18 10:31 PM   Specimen: BLOOD LEFT HAND  Result Value Ref Range Status   Specimen Description BLOOD LEFT HAND  Final   Special Requests   Final    BOTTLES DRAWN AEROBIC ONLY Blood Culture results may not be optimal due to an inadequate volume of blood received in culture bottles   Culture   Final    NO GROWTH 2 DAYS Performed at [facility], [place] [address], [place], [place] [address]    Report Status PENDING  Incomplete    Radiology Reports Dg Chest Port 1 View  Result Date: 11/30/2018 CLINICAL DATA:  63 year old female with history of shortness of breath. Multiple family contacts positive for COVID-19. EXAM: PORTABLE CHEST 1 VIEW COMPARISON:  Chest x-ray 11/29/2018. FINDINGS: Lung volumes are low. Diffuse peribronchial cuffing. Widespread areas of ground-glass attenuation most evident  throughout the mid to lower lungs bilaterally (right greater than left) with some associated septal thickening, increased compared to the prior examination. No definite pleural effusions. No evidence of pulmonary edema. Heart size is normal. The patient is rotated to the left on today's exam, resulting in distortion of the mediastinal contours and reduced diagnostic sensitivity and specificity for mediastinal pathology. IMPRESSION: 1. The appearance the chest is concerning for progressive multilobar pneumonia, as above. Electronically Signed   By: Mauri Brooklyn.D.  On: 11/30/2018 08:18   Dg Chest Portable 1 View  Result Date: 11/29/2018 CLINICAL DATA:  63 year old female with shortness of breath. EXAM: PORTABLE CHEST 1 VIEW COMPARISON:  None. FINDINGS: Background of emphysema and chronic interstitial coarsening. Left lung base linear atelectasis/scarring. No focal consolidation, pleural effusion, or pneumothorax. The cardiac silhouette is within normal limits. No acute osseous pathology. IMPRESSION: No active disease. Electronically Signed   By: Anner Crete M.D.   On: 11/29/2018 22:15

## 2018-12-01 NOTE — Progress Notes (Signed)
Notified daughter of progress.  All questions were answered and this nurse's contact number shared for further communication.   

## 2018-12-01 NOTE — Progress Notes (Addendum)
Patient refusing to prone. HOB 45 degrees, patient wants to stay supine,  decreased oxygen back to 2L nasal cannula, sats 94-95%

## 2018-12-02 LAB — LACTATE DEHYDROGENASE: LDH: 343 U/L — ABNORMAL HIGH (ref 98–192)

## 2018-12-02 LAB — CBC WITH DIFFERENTIAL/PLATELET
Abs Immature Granulocytes: 0.21 10*3/uL — ABNORMAL HIGH (ref 0.00–0.07)
Basophils Absolute: 0 10*3/uL (ref 0.0–0.1)
Basophils Relative: 0 %
Eosinophils Absolute: 0 10*3/uL (ref 0.0–0.5)
Eosinophils Relative: 0 %
HCT: 31.9 % — ABNORMAL LOW (ref 36.0–46.0)
Hemoglobin: 10 g/dL — ABNORMAL LOW (ref 12.0–15.0)
Immature Granulocytes: 2 %
Lymphocytes Relative: 10 %
Lymphs Abs: 1.2 10*3/uL (ref 0.7–4.0)
MCH: 25.3 pg — ABNORMAL LOW (ref 26.0–34.0)
MCHC: 31.3 g/dL (ref 30.0–36.0)
MCV: 80.8 fL (ref 80.0–100.0)
Monocytes Absolute: 0.5 10*3/uL (ref 0.1–1.0)
Monocytes Relative: 4 %
Neutro Abs: 9.8 10*3/uL — ABNORMAL HIGH (ref 1.7–7.7)
Neutrophils Relative %: 84 %
Platelets: 528 10*3/uL — ABNORMAL HIGH (ref 150–400)
RBC: 3.95 MIL/uL (ref 3.87–5.11)
RDW: 13.8 % (ref 11.5–15.5)
WBC: 11.8 10*3/uL — ABNORMAL HIGH (ref 4.0–10.5)
nRBC: 0.9 % — ABNORMAL HIGH (ref 0.0–0.2)

## 2018-12-02 LAB — COMPREHENSIVE METABOLIC PANEL
ALT: 24 U/L (ref 0–44)
AST: 36 U/L (ref 15–41)
Albumin: 2.4 g/dL — ABNORMAL LOW (ref 3.5–5.0)
Alkaline Phosphatase: 81 U/L (ref 38–126)
Anion gap: 11 (ref 5–15)
BUN: 19 mg/dL (ref 8–23)
CO2: 25 mmol/L (ref 22–32)
Calcium: 8.7 mg/dL — ABNORMAL LOW (ref 8.9–10.3)
Chloride: 104 mmol/L (ref 98–111)
Creatinine, Ser: 0.72 mg/dL (ref 0.44–1.00)
GFR calc Af Amer: >60 mL/min (ref >60)
GFR calc non Af Amer: >60 mL/min (ref >60)
Glucose, Bld: 113 mg/dL — ABNORMAL HIGH (ref 70–99)
Potassium: 4.5 mmol/L (ref 3.5–5.1)
Sodium: 140 mmol/L (ref 135–145)
Total Bilirubin: 0.4 mg/dL (ref 0.3–1.2)
Total Protein: 6.5 g/dL (ref 6.5–8.1)

## 2018-12-02 LAB — FERRITIN: Ferritin: 256 ng/mL (ref 11–307)

## 2018-12-02 LAB — GLUCOSE, CAPILLARY
Glucose-Capillary: 143 mg/dL — ABNORMAL HIGH (ref 70–99)
Glucose-Capillary: 125 mg/dL — ABNORMAL HIGH (ref 70–99)

## 2018-12-02 LAB — D-DIMER, QUANTITATIVE: D-Dimer, Quant: 1.26 ug/mL-FEU — ABNORMAL HIGH (ref 0.00–0.50)

## 2018-12-02 LAB — C-REACTIVE PROTEIN: CRP: 19.8 mg/dL — ABNORMAL HIGH (ref <1.0)

## 2018-12-02 LAB — BRAIN NATRIURETIC PEPTIDE: B Natriuretic Peptide: 157.6 pg/mL — ABNORMAL HIGH (ref 0.0–100.0)

## 2018-12-02 LAB — MAGNESIUM: Magnesium: 2.1 mg/dL (ref 1.7–2.4)

## 2018-12-02 NOTE — Progress Notes (Signed)
SATURATION QUALIFICATIONS: (This note is used to comply with regulatory documentation for home oxygen)  Patient Saturations on Room Air at Rest = 95%  Patient Saturations on Room Air while Ambulating = 82%  Patient Saturations on 1 Liters of oxygen while Ambulating = 92%  Please briefly explain why patient needs home oxygen: To maintain O2 sats >88%    Barry Brunner, PT

## 2018-12-02 NOTE — Progress Notes (Signed)
PROGRESS NOTE                                                                                                                                                                                                             Patient Demographics:    Nichole Hanson, is a 63 y.o. female, DOB - October 14, 1955, PFX:[id number]  Outpatient Primary MD for the patient is Robyne Peers, MD    LOS - 2  Admit date - 11/29/2018    Chief Complaint  Patient presents with   Shortness of Breath       Brief Narrative  63 year old female with a history of asthma (only takes as needed Zyrtec), CKD stage II, tobacco abuse, hypertension presented with shortness of breath, cough, fevers and chills for almost 10 days.  Worsened on the day of admission.  No chest pain, palpitations, reported nausea but no vomiting.  No abdominal pain or diarrhea.  Reportedly her family member was tested positive for COVID recently.  In the ER she was diagnosed with COVID-19 infection and admitted.    Subjective:   Patient in bed, appears comfortable, denies any headache, no fever, no chest pain or pressure, no shortness of breath , no abdominal pain. No focal weakness.    Assessment  & Plan :     1. Acute Hypoxic Resp. Failure due to Acute Covid 19 Viral Pneumonitis during the ongoing 2020 Covid 19 Pandemic - initially patient was on room air, she subsequently developed more hypoxia and required 3 L nasal cannula oxygen, chest x-ray progressed from stable to bilateral bibasilar infiltrates, she was also febrile and CRP was high.  She was started on appropriate treatment immediately which included IV steroids, Remdisvir and a dose of Actemra on the day of admission.  She is now feeling much better, less hypoxic, down to 1 L nasal cannula oxygen.  She has been encouraged to sit up in chair in the daytime use flutter valve and I-S for pulmonary toiletry and then prone at night in  bed.  Continue to monitor clinically.  Note patient had consented on the off label use of Actemra kindly see the note from 11/30/2018.  Continue to monitor inflammatory markers and clinically, encouraged to sit up in chair use flutter valve and I-S for pulmonary toiletry in the daytime and prone at night.  COVID-19  Labs  Recent Labs    11/29/18 2059 12/01/18 0319 12/02/18 0415  DDIMER 0.99* 1.88* 1.26*  FERRITIN 279 307  --   LDH 324* 301* 343*  CRP 37.7* 35.9*  --     Lab Results  Component Value Date   SARSCOV2NAA POSITIVE (A) 11/29/2018     Hepatic Function Latest Ref Rng & Units 12/02/2018 12/01/2018 11/29/2018  Total Protein 6.5 - 8.1 g/dL 6.5 6.8 7.5  Albumin 3.5 - 5.0 g/dL 2.4(L) 2.5(L) 2.9(L)  AST 15 - 41 U/L 36 37 43(H)  ALT 0 - 44 U/L '24 24 27  ' Alk Phosphatase 38 - 126 U/L 81 88 95  Total Bilirubin 0.3 - 1.2 mg/dL 0.4 0.7 1.1        Component Value Date/Time   BNP 66.3 12/01/2018 0319      2.  Smoking.  Counseled to quit.  On NicoDerm patch.  3.  Hypertension.  Blood pressure stable HCTZ on hold will add low-dose IV hydralazine as needed.  4.  History of chronic depression.  Stable on Wellbutrin.  5.  CKD 2.  At baseline.  6.  Hypokalemia.  Replaced.  7.  History of RA.  Likely moderate to severe disease.  Baseline CRP likely slightly higher.  Outpatient follow-up no acute issues.      Condition -  Guarded  Family Communication  :  None  Code Status :  Full  Diet :   Diet Order            Diet Heart Room service appropriate? Yes; Fluid consistency: Thin  Diet effective now               Disposition Plan  :  TBD  Consults  :  None  Procedures  :     PUD Prophylaxis :  None  DVT Prophylaxis  :  Lovenox    Lab Results  Component Value Date   PLT 528 (H) 12/02/2018    Inpatient Medications  Scheduled Meds:  buPROPion  150 mg Oral Daily   enoxaparin (LOVENOX) injection  40 mg Subcutaneous Daily   ipratropium  2 puff  Inhalation Q4H   methylPREDNISolone (SOLU-MEDROL) injection  40 mg Intravenous Q12H   nicotine  21 mg Transdermal Daily   traZODone  50 mg Oral QHS   vitamin C  500 mg Oral Daily   zinc sulfate  220 mg Oral Daily   Continuous Infusions:  remdesivir 100 mg in NS 250 mL Stopped (12/01/18 1230)   PRN Meds:.acetaminophen, dextromethorphan-guaiFENesin, diphenhydrAMINE, hydrALAZINE, levalbuterol, [DISCONTINUED] ondansetron **OR** ondansetron (ZOFRAN) IV  Antibiotics  :    Anti-infectives (From admission, onward)   Start     Dose/Rate Route Frequency Ordered Stop   12/01/18 1200  remdesivir 100 mg in sodium chloride 0.9 % 250 mL IVPB     100 mg 500 mL/hr over 30 Minutes Intravenous Every 24 hours 11/30/18 1100 12/05/18 1159   11/30/18 1200  remdesivir 200 mg in sodium chloride 0.9 % 250 mL IVPB     200 mg 500 mL/hr over 30 Minutes Intravenous Once 11/30/18 1100 11/30/18 1300       Time Spent in minutes  30   Lala Lund M.D on 12/02/2018 at 8:34 AM  To page go to [email or web address] - password [facility]  [facility] -  Office  [phone]   See all Orders from today for further details    Objective:   Vitals:   12/01/18 2325 12/02/18 0355 12/02/18 0515 12/02/18 2703  BP: 136/74  130/76 135/77  Pulse: 76  72 78  Resp: (!) 29  (!) 29 (!) 21  Temp: 98.7 F (37.1 C)  98.3 F (36.8 C) 97.9 F (36.6 C)  TempSrc: Oral  Oral Oral  SpO2: 94% 96%  92%  Weight:      Height:        Wt Readings from Last 3 Encounters:  11/29/18 59 kg  08/04/13 59 kg  08/15/11 59.1 kg     Intake/Output Summary (Last 24 hours) at 12/02/2018 0834 Last data filed at 12/02/2018 0738 Gross per 24 hour  Intake 3302.28 ml  Output --  Net 3302.28 ml     Physical Exam  Awake Alert, Oriented X 3, No new F.N deficits, Normal affect [place].AT,PERRAL Supple Neck,No JVD, No cervical lymphadenopathy appriciated.  Symmetrical Chest wall movement, Good air movement bilaterally, CTAB RRR,No  Gallops, Rubs or new Murmurs, No Parasternal Heave +ve B.Sounds, Abd Soft, No tenderness, No organomegaly appriciated, No rebound - guarding or rigidity. No Cyanosis, Clubbing or edema, No new Rash or bruise    Data Review:    CBC Recent Labs  Lab 11/29/18 2059 12/01/18 0319 12/02/18 0415  WBC 8.7 6.7 11.8*  HGB 11.3* 10.1* 10.0*  HCT 34.5* 32.0* 31.9*  PLT 381 456* 528*  MCV 79.7* 79.0* 80.8  MCH 26.1 24.9* 25.3*  MCHC 32.8 31.6 31.3  RDW 13.2 13.3 13.8  LYMPHSABS 1.0 0.6* 1.2  MONOABS 0.5 0.2 0.5  EOSABS 0.0 0.0 0.0  BASOSABS 0.0 0.0 0.0    Chemistries  Recent Labs  Lab 11/29/18 2059 12/01/18 0319 12/02/18 0415  NA 134* 140 140  K 3.8 3.1* 4.5  CL 91* 103 104  CO2 '26 25 25  ' GLUCOSE 130* 129* 113*  BUN '10 15 19  ' CREATININE 1.05* 0.66 0.72  CALCIUM 9.0 8.5* 8.7*  MG  --  2.3 2.1  AST 43* 37 36  ALT '27 24 24  ' ALKPHOS 95 88 81  BILITOT 1.1 0.7 0.4   ------------------------------------------------------------------------------------------------------------------ Recent Labs    11/29/18 2059  TRIG 122    No results found for: HGBA1C ------------------------------------------------------------------------------------------------------------------ No results for input(s): TSH, T4TOTAL, T3FREE, THYROIDAB in the last 72 hours.  Invalid input(s): FREET3  Cardiac Enzymes No results for input(s): CKMB, TROPONINI, MYOGLOBIN in the last 168 hours.  Invalid input(s): CK ------------------------------------------------------------------------------------------------------------------    Component Value Date/Time   BNP 66.3 12/01/2018 0319    Micro Results Recent Results (from the past 240 hour(s))  SARS Coronavirus 2 [facility] order, Performed in [place] hospital lab)     Status: Abnormal   Collection Time: 11/29/18 10:08 PM   Specimen: Nasopharyngeal Swab  Result Value Ref Range Status   SARS Coronavirus 2 POSITIVE (A) NEGATIVE Final    Comment:  RESULT CALLED TO, READ BACK BY AND VERIFIED WITH: Bennie Hind RN 11/29/18 2323 JDW (NOTE) If result is NEGATIVE SARS-CoV-2 target nucleic acids are NOT DETECTED. The SARS-CoV-2 RNA is generally detectable in upper and lower  respiratory specimens during the acute phase of infection. The lowest  concentration of SARS-CoV-2 viral copies this assay can detect is 250  copies / mL. A negative result does not preclude SARS-CoV-2 infection  and should not be used as the sole basis for treatment or other  patient management decisions.  A negative result may occur with  improper specimen collection / handling, submission of specimen other  than nasopharyngeal swab, presence of viral mutation(s) within the  areas targeted by this  assay, and inadequate number of viral copies  (<250 copies / mL). A negative result must be combined with clinical  observations, patient history, and epidemiological information. If result is POSITIVE SARS-CoV-2 target nucleic acids are DETECTED. The SAR S-CoV-2 RNA is generally detectable in upper and lower  respiratory specimens during the acute phase of infection.  Positive  results are indicative of active infection with SARS-CoV-2.  Clinical  correlation with patient history and other diagnostic information is  necessary to determine patient infection status.  Positive results do  not rule out bacterial infection or co-infection with other viruses. If result is PRESUMPTIVE POSTIVE SARS-CoV-2 nucleic acids MAY BE PRESENT.   A presumptive positive result was obtained on the submitted specimen  and confirmed on repeat testing.  While 2019 novel coronavirus  (SARS-CoV-2) nucleic acids may be present in the submitted sample  additional confirmatory testing may be necessary for epidemiological  and / or clinical management purposes  to differentiate between  SARS-CoV-2 and other Sarbecovirus currently known to infect humans.  If clinically indicated additional testing  with an alternate test  methodology [phone]) is advis ed. The SARS-CoV-2 RNA is generally  detectable in upper and lower respiratory specimens during the acute  phase of infection. The expected result is Negative. Fact Sheet for Patients:  StrictlyIdeas.no Fact Sheet for Healthcare Providers: BankingDealers.co.za This test is not yet approved or cleared by the [place] FDA and has been authorized for detection and/or diagnosis of SARS-CoV-2 by FDA under an Emergency Use Authorization (EUA).  This EUA will remain in effect (meaning this test can be used) for the duration of the COVID-19 declaration under Section 564(b)(1) of the Act, 21 U.S.C. section 360bbb-3(b)(1), unless the authorization is terminated or revoked sooner. Performed at [facility], [place] [address], [place], [place] [address]   Blood Culture (routine x 2)     Status: None (Preliminary result)   Collection Time: 11/29/18 10:20 PM   Specimen: BLOOD RIGHT HAND  Result Value Ref Range Status   Specimen Description BLOOD RIGHT HAND  Final   Special Requests   Final    BOTTLES DRAWN AEROBIC AND ANAEROBIC Blood Culture adequate volume   Culture   Final    NO GROWTH 3 DAYS Performed at [facility], [place] [address], [place], [place] [address]    Report Status PENDING  Incomplete  Blood Culture (routine x 2)     Status: None (Preliminary result)   Collection Time: 11/29/18 10:31 PM   Specimen: BLOOD LEFT HAND  Result Value Ref Range Status   Specimen Description BLOOD LEFT HAND  Final   Special Requests   Final    BOTTLES DRAWN AEROBIC ONLY Blood Culture results may not be optimal due to an inadequate volume of blood received in culture bottles   Culture   Final    NO GROWTH 3 DAYS Performed at [facility], [place] [address], [place], [place] [address]    Report Status PENDING  Incomplete    Radiology Reports Dg Chest Port 1 View  Result Date:  11/30/2018 CLINICAL DATA:  63 year old female with history of shortness of breath. Multiple family contacts positive for COVID-19. EXAM: PORTABLE CHEST 1 VIEW COMPARISON:  Chest x-ray 11/29/2018. FINDINGS: Lung volumes are low. Diffuse peribronchial cuffing. Widespread areas of ground-glass attenuation most evident throughout the mid to lower lungs bilaterally (right greater than left) with some associated septal thickening, increased compared to the prior examination. No definite pleural effusions.  No evidence of pulmonary edema. Heart size is normal. The patient is rotated to the left on today's exam, resulting in distortion of the mediastinal contours and reduced diagnostic sensitivity and specificity for mediastinal pathology. IMPRESSION: 1. The appearance the chest is concerning for progressive multilobar pneumonia, as above. Electronically Signed   By: Vinnie Langton M.D.   On: 11/30/2018 08:18   Dg Chest Portable 1 View  Result Date: 11/29/2018 CLINICAL DATA:  63 year old female with shortness of breath. EXAM: PORTABLE CHEST 1 VIEW COMPARISON:  None. FINDINGS: Background of emphysema and chronic interstitial coarsening. Left lung base linear atelectasis/scarring. No focal consolidation, pleural effusion, or pneumothorax. The cardiac silhouette is within normal limits. No acute osseous pathology. IMPRESSION: No active disease. Electronically Signed   By: Anner Crete M.D.   On: 11/29/2018 22:15

## 2018-12-02 NOTE — Progress Notes (Signed)
Physical Therapy Treatment Patient Details Name: Nichole Hanson MRN: [id number] DOB: 1955/08/19 Today's Date: 12/02/2018    History of Present Illness 63 y.o. female admitted on 11/29/18 for cough, SOB, fever, chills.  Dx with COVID 19 PNA.  Pt with significant PMH of CKD.     PT Comments    Patient tolerated most activity on room air (seated exercises; initial walk x 66ft). On return walk x 50ft sats decr to 82% and required 1 L O2 to recover. Educated on seated exercises and then assisted to/from bathroom with focus on safety with O2 extension tubing.     Follow Up Recommendations  No PT follow up     Equipment Recommendations  None recommended by PT    Recommendations for Other Services       Precautions / Restrictions Precautions Precautions: Other (comment) Precaution Comments: monitor O2 sats closely Restrictions Weight Bearing Restrictions: No    Mobility  Bed Mobility Overal bed mobility: Modified Independent                Transfers Overall transfer level: Needs assistance   Transfers: Sit to/from Stand Sit to Stand: Supervision         General transfer comment: supervision for safety and line management; 3 reps including off toilet  Ambulation/Gait Ambulation/Gait assistance: Supervision Gait Distance (Feet): 75 Feet(standing rest, 75; 15 x2 ) Assistive device: None Gait Pattern/deviations: Step-through pattern;Staggering right;Staggering left     General Gait Details: no imbalance, however walking more quickly with incr SOB; sats decr to 85% on room air and able to incr to 95% with standing rest; on return to room, down to 82% on room air, slowly recovering and returned to 1L O2 with return to 98%   Stairs             Wheelchair Mobility    Modified Rankin (Stroke Patients Only)       Balance Overall balance assessment: Needs assistance Sitting-balance support: Feet supported;No upper extremity supported Sitting balance-Leahy  Scale: Good     Standing balance support: No upper extremity supported Standing balance-Leahy Scale: Good                              Cognition Arousal/Alertness: Awake/alert Behavior During Therapy: WFL for tasks assessed/performed Overall Cognitive Status: Within Functional Limits for tasks assessed                                        Exercises General Exercises - Lower Extremity Long Arc Quad: AROM;Both;10 reps Hip Flexion/Marching: AROM;Both;10 reps;Seated Other Exercises Other Exercises: shoulder horiz abduction with level 1 theraband x 5 reps and then rest Other Exercises: pt with IV to rt antecubital space and therefore did not do elbow flexion or extension with band (PT did demonstrate for pt)    General Comments        Pertinent Vitals/Pain      Home Living                      Prior Function            PT Goals (current goals can now be found in the care plan section) Acute Rehab PT Goals Patient Stated Goal: to improve her breathing and go home as soon as she can Time For Goal Achievement: 12/14/18 Potential to  Achieve Goals: Good Progress towards PT goals: Progressing toward goals    Frequency    Min 3X/week      PT Plan Current plan remains appropriate    Co-evaluation              AM-PAC PT "6 Clicks" Mobility   Outcome Measure  Help needed turning from your back to your side while in a flat bed without using bedrails?: None Help needed moving from lying on your back to sitting on the side of a flat bed without using bedrails?: None Help needed moving to and from a bed to a chair (including a wheelchair)?: None Help needed standing up from a chair using your arms (e.g., wheelchair or bedside chair)?: None Help needed to walk in hospital room?: None Help needed climbing 3-5 steps with a railing? : A Little 6 Click Score: 23    End of Session Equipment Utilized During Treatment:  Oxygen(1L) Activity Tolerance: Patient limited by fatigue Patient left: in chair;with call bell/phone within reach   PT Visit Diagnosis: Muscle weakness (generalized) (M62.81);Difficulty in walking, not elsewhere classified (R26.2)     Time: 1417-1510 PT Time Calculation (min) (ACUTE ONLY): 53 min  Charges:  $Gait Training: 38-52 mins $Therapeutic Exercise: 8-22 mins                       Barry Brunner, PT       Nichole Hanson 12/02/2018, 3:20 PM

## 2018-12-02 NOTE — Progress Notes (Signed)
Patient reports seeing people in her room, having hallucinations.  Notified MD.

## 2018-12-03 LAB — CBC WITH DIFFERENTIAL/PLATELET
Abs Immature Granulocytes: 0.38 10*3/uL — ABNORMAL HIGH (ref 0.00–0.07)
Basophils Absolute: 0 10*3/uL (ref 0.0–0.1)
Basophils Relative: 0 %
Eosinophils Absolute: 0 10*3/uL (ref 0.0–0.5)
Eosinophils Relative: 0 %
HCT: 31.4 % — ABNORMAL LOW (ref 36.0–46.0)
Hemoglobin: 9.9 g/dL — ABNORMAL LOW (ref 12.0–15.0)
Immature Granulocytes: 4 %
Lymphocytes Relative: 13 %
Lymphs Abs: 1.3 10*3/uL (ref 0.7–4.0)
MCH: 25.1 pg — ABNORMAL LOW (ref 26.0–34.0)
MCHC: 31.5 g/dL (ref 30.0–36.0)
MCV: 79.7 fL — ABNORMAL LOW (ref 80.0–100.0)
Monocytes Absolute: 0.6 10*3/uL (ref 0.1–1.0)
Monocytes Relative: 5 %
Neutro Abs: 8.2 10*3/uL — ABNORMAL HIGH (ref 1.7–7.7)
Neutrophils Relative %: 78 %
Platelets: 540 10*3/uL — ABNORMAL HIGH (ref 150–400)
RBC: 3.94 MIL/uL (ref 3.87–5.11)
RDW: 14 % (ref 11.5–15.5)
WBC: 10.5 10*3/uL (ref 4.0–10.5)
nRBC: 1.7 % — ABNORMAL HIGH (ref 0.0–0.2)

## 2018-12-03 LAB — C-REACTIVE PROTEIN: CRP: 9.6 mg/dL — ABNORMAL HIGH (ref <1.0)

## 2018-12-03 LAB — COMPREHENSIVE METABOLIC PANEL
ALT: 31 U/L (ref 0–44)
AST: 44 U/L — ABNORMAL HIGH (ref 15–41)
Albumin: 2.4 g/dL — ABNORMAL LOW (ref 3.5–5.0)
Alkaline Phosphatase: 66 U/L (ref 38–126)
Anion gap: 12 (ref 5–15)
BUN: 20 mg/dL (ref 8–23)
CO2: 22 mmol/L (ref 22–32)
Calcium: 8.6 mg/dL — ABNORMAL LOW (ref 8.9–10.3)
Chloride: 106 mmol/L (ref 98–111)
Creatinine, Ser: 0.77 mg/dL (ref 0.44–1.00)
GFR calc Af Amer: >60 mL/min (ref >60)
GFR calc non Af Amer: >60 mL/min (ref >60)
Glucose, Bld: 100 mg/dL — ABNORMAL HIGH (ref 70–99)
Potassium: 4.5 mmol/L (ref 3.5–5.1)
Sodium: 140 mmol/L (ref 135–145)
Total Bilirubin: 0.3 mg/dL (ref 0.3–1.2)
Total Protein: 6 g/dL — ABNORMAL LOW (ref 6.5–8.1)

## 2018-12-03 LAB — LACTATE DEHYDROGENASE: LDH: 304 U/L — ABNORMAL HIGH (ref 98–192)

## 2018-12-03 LAB — MAGNESIUM: Magnesium: 1.9 mg/dL (ref 1.7–2.4)

## 2018-12-03 LAB — BRAIN NATRIURETIC PEPTIDE: B Natriuretic Peptide: 170.6 pg/mL — ABNORMAL HIGH (ref 0.0–100.0)

## 2018-12-03 LAB — D-DIMER, QUANTITATIVE: D-Dimer, Quant: 1.1 ug/mL-FEU — ABNORMAL HIGH (ref 0.00–0.50)

## 2018-12-03 LAB — FERRITIN: Ferritin: 164 ng/mL (ref 11–307)

## 2018-12-03 NOTE — TOC Transition Note (Signed)
Transition of Care [facility]) - CM/SW Discharge Note   Patient Details  Name: Nichole Hanson MRN: [id number] Date of Birth: [birth year]  [facility] of [facility] CM/SW Contact:  Ninfa Meeker, RN Phone Number: [phone] (working remotely) 12/03/2018, 2:43 PM   Clinical Narrative:  63 yr old female admitted and treated for COVID 19. Thankfully she is improving and will discharge home in a day or so. Case manager spoke with patient via telephone to discuss need for oxygen at discharge. Patient has no preference of agency.  Referral was called to Learta Codding, [facility]. Patient will have family support at discharge. She asked that her daughter, [name] be called concerning oxygen delivery: [phone]. Patient's husband will be home to accept.    Final next level of care: Home/Self Care Barriers to Discharge: No Barriers Identified   Patient Goals and CMS Choice Patient states their goals for this hospitalization and ongoing recovery are:: to continue to get better   Choice offered to / list presented to : Patient  Discharge Placement                       Discharge Plan and Services In-house Referral: NA Discharge Planning Services: CM Consult Post Acute Care Choice: Durable Medical Equipment          DME Arranged: Oxygen DME Agency: [place] Date DME Agency Contacted: 12/03/18 Time DME Agency Contacted: [phone] Representative spoke with at DME Agency: Learta Codding [facility] Arranged: NA   Date [place]: 12/03/18      Social Determinants of Health ([place]) Interventions     Readmission Risk Interventions No flowsheet data found.

## 2018-12-03 NOTE — Progress Notes (Signed)
PROGRESS NOTE                                                                                                                                                                                                             Patient Demographics:    Nichole Hanson, is a 63 y.o. female, DOB - August 05, 1955, BWG:[id number]  Outpatient Primary MD for the patient is Donald Prose, MD    LOS - 3  Admit date - 11/29/2018    Chief Complaint  Patient presents with   Shortness of Breath       Brief Narrative  63 year old female with a history of asthma (only takes as needed Zyrtec), CKD stage II, tobacco abuse, hypertension presented with shortness of breath, cough, fevers and chills for almost 10 days.  Worsened on the day of admission.  No chest pain, palpitations, reported nausea but no vomiting.  No abdominal pain or diarrhea.  Reportedly her family member was tested positive for COVID recently.  In the ER she was diagnosed with COVID-19 infection and admitted.    Subjective:   Patient in bed, appears comfortable, denies any headache, no fever, no chest pain or pressure, no shortness of breath , no abdominal pain. No focal weakness.   Assessment  & Plan :     1. Acute Hypoxic Resp. Failure due to Acute Covid 19 Viral Pneumonitis during the ongoing 2020 Covid 19 Pandemic - initially patient was on room air, she subsequently developed more hypoxia and required 3 L nasal cannula oxygen, chest x-ray progressed from stable to bilateral bibasilar infiltrates, she was also febrile and CRP was high.  She was started on appropriate treatment immediately which included IV steroids, Remdisvir and a dose of Actemra on the day of admission.  She is now feeling much better, less hypoxic, down to 1-2 L nasal cannula oxygen.  Inflammatory markers finally trending down.  She has been encouraged to sit up in chair in the daytime use flutter valve and I-S for  pulmonary toiletry and then prone at night in bed. Continue to advance activity and titrate down oxygen.  Home on 12/04/2018 once she finishes her Remdisvir course, may require home oxygen.    COVID-19 Labs  Recent Labs    12/01/18 0319 12/02/18 0415 12/03/18 0240  DDIMER 1.88* 1.26* 1.10*  FERRITIN 307 256 164  LDH 301* 343* 304*  CRP 35.9* 19.8* 9.6*    Lab Results  Component Value Date   SARSCOV2NAA POSITIVE (A) 11/29/2018     Hepatic Function Latest Ref Rng & Units 12/03/2018 12/02/2018 12/01/2018  Total Protein 6.5 - 8.1 g/dL 6.0(L) 6.5 6.8  Albumin 3.5 - 5.0 g/dL 2.4(L) 2.4(L) 2.5(L)  AST 15 - 41 U/L 44(H) 36 37  ALT 0 - 44 U/L '31 24 24  ' Alk Phosphatase 38 - 126 U/L 66 81 88  Total Bilirubin 0.3 - 1.2 mg/dL 0.3 0.4 0.7        Component Value Date/Time   BNP 170.6 (H) 12/03/2018 0240      2.  Smoking.  Counseled to quit.  On NicoDerm patch.  3.  Hypertension.  Blood pressure stable HCTZ on hold will add low-dose IV hydralazine as needed.  4.  History of chronic depression.  Stable on Wellbutrin.  5.  CKD 2.  At baseline.  6.  Hypokalemia.  Replaced.  7.  History of RA.  Likely moderate to severe disease.  Baseline CRP likely slightly higher.  Outpatient follow-up no acute issues.      Condition -  Guarded  Family Communication  :  None  Code Status :  Full  Diet :   Diet Order            Diet Heart Room service appropriate? Yes; Fluid consistency: Thin  Diet effective now               Disposition Plan  : Home 12/04/2018 once she finishes Remdisvir course  Consults  :  None  Procedures  :     PUD Prophylaxis :  None  DVT Prophylaxis  :  Lovenox    Lab Results  Component Value Date   PLT 540 (H) 12/03/2018    Inpatient Medications  Scheduled Meds:  buPROPion  150 mg Oral Daily   enoxaparin (LOVENOX) injection  40 mg Subcutaneous Daily   ipratropium  2 puff Inhalation Q4H   methylPREDNISolone (SOLU-MEDROL) injection  40 mg  Intravenous Q12H   nicotine  21 mg Transdermal Daily   traZODone  50 mg Oral QHS   vitamin C  500 mg Oral Daily   zinc sulfate  220 mg Oral Daily   Continuous Infusions:  remdesivir 100 mg in NS 250 mL Stopped (12/02/18 1300)   PRN Meds:.acetaminophen, dextromethorphan-guaiFENesin, diphenhydrAMINE, hydrALAZINE, levalbuterol, [DISCONTINUED] ondansetron **OR** ondansetron (ZOFRAN) IV  Antibiotics  :    Anti-infectives (From admission, onward)   Start     Dose/Rate Route Frequency Ordered Stop   12/01/18 1200  remdesivir 100 mg in sodium chloride 0.9 % 250 mL IVPB     100 mg 500 mL/hr over 30 Minutes Intravenous Every 24 hours 11/30/18 1100 12/05/18 1159   11/30/18 1200  remdesivir 200 mg in sodium chloride 0.9 % 250 mL IVPB     200 mg 500 mL/hr over 30 Minutes Intravenous Once 11/30/18 1100 11/30/18 1300       Time Spent in minutes  30   Lala Lund M.D on 12/03/2018 at 9:01 AM  To page go to [email or web address] - password [email or web address]  [facility] -  Office  [phone]   See all Orders from today for further details    Objective:   Vitals:   12/02/18 1549 12/02/18 1957 12/03/18 0422 12/03/18 0817  BP: 132/88 128/83 123/78 134/79  Pulse: 60  77 87  Resp: (!) 22  20 (!) 21  Temp:  98.4 F (36.9 C) 98.5 F (36.9 C) 98.6 F (37 C) 98.8 F (37.1 C)  TempSrc: Oral Oral Oral Oral  SpO2: 92% 94% 95% (!) 88%  Weight:      Height:        Wt Readings from Last 3 Encounters:  11/29/18 59 kg  08/04/13 59 kg  08/15/11 59.1 kg     Intake/Output Summary (Last 24 hours) at 12/03/2018 0901 Last data filed at 12/03/2018 0817 Gross per 24 hour  Intake 1450 ml  Output 600 ml  Net 850 ml     Physical Exam  Awake Alert, Oriented X 3, No new F.N deficits, Normal affect [place].AT,PERRAL Supple Neck,No JVD, No cervical lymphadenopathy appriciated.  Symmetrical Chest wall movement, Good air movement bilaterally, CTAB RRR,No Gallops, Rubs or new Murmurs, No Parasternal  Heave +ve B.Sounds, Abd Soft, No tenderness, No organomegaly appriciated, No rebound - guarding or rigidity. No Cyanosis, Clubbing or edema, No new Rash or bruise     Data Review:    CBC Recent Labs  Lab 11/29/18 2059 12/01/18 0319 12/02/18 0415 12/03/18 0240  WBC 8.7 6.7 11.8* 10.5  HGB 11.3* 10.1* 10.0* 9.9*  HCT 34.5* 32.0* 31.9* 31.4*  PLT 381 456* 528* 540*  MCV 79.7* 79.0* 80.8 79.7*  MCH 26.1 24.9* 25.3* 25.1*  MCHC 32.8 31.6 31.3 31.5  RDW 13.2 13.3 13.8 14.0  LYMPHSABS 1.0 0.6* 1.2 1.3  MONOABS 0.5 0.2 0.5 0.6  EOSABS 0.0 0.0 0.0 0.0  BASOSABS 0.0 0.0 0.0 0.0    Chemistries  Recent Labs  Lab 11/29/18 2059 12/01/18 0319 12/02/18 0415 12/03/18 0240  NA 134* 140 140 140  K 3.8 3.1* 4.5 4.5  CL 91* 103 104 106  CO2 '26 25 25 22  ' GLUCOSE 130* 129* 113* 100*  BUN '10 15 19 20  ' CREATININE 1.05* 0.66 0.72 0.77  CALCIUM 9.0 8.5* 8.7* 8.6*  MG  --  2.3 2.1 1.9  AST 43* 37 36 44*  ALT '27 24 24 31  ' ALKPHOS 95 88 81 66  BILITOT 1.1 0.7 0.4 0.3   ------------------------------------------------------------------------------------------------------------------ No results for input(s): CHOL, HDL, LDLCALC, TRIG, CHOLHDL, LDLDIRECT in the last 72 hours.  No results found for: HGBA1C ------------------------------------------------------------------------------------------------------------------ No results for input(s): TSH, T4TOTAL, T3FREE, THYROIDAB in the last 72 hours.  Invalid input(s): FREET3  Cardiac Enzymes No results for input(s): CKMB, TROPONINI, MYOGLOBIN in the last 168 hours.  Invalid input(s): CK ------------------------------------------------------------------------------------------------------------------    Component Value Date/Time   BNP 170.6 (H) 12/03/2018 0240    Micro Results Recent Results (from the past 240 hour(s))  SARS Coronavirus 2 [facility] order, Performed in [facility] hospital lab)     Status: Abnormal   Collection Time:  11/29/18 10:08 PM   Specimen: Nasopharyngeal Swab  Result Value Ref Range Status   SARS Coronavirus 2 POSITIVE (A) NEGATIVE Final    Comment: RESULT CALLED TO, READ BACK BY AND VERIFIED WITH: Bennie Hind RN 11/29/18 2323 JDW (NOTE) If result is NEGATIVE SARS-CoV-2 target nucleic acids are NOT DETECTED. The SARS-CoV-2 RNA is generally detectable in upper and lower  respiratory specimens during the acute phase of infection. The lowest  concentration of SARS-CoV-2 viral copies this assay can detect is 250  copies / mL. A negative result does not preclude SARS-CoV-2 infection  and should not be used as the sole basis for treatment or other  patient management decisions.  A negative result may occur with  improper specimen collection / handling, submission of  specimen other  than nasopharyngeal swab, presence of viral mutation(s) within the  areas targeted by this assay, and inadequate number of viral copies  (<250 copies / mL). A negative result must be combined with clinical  observations, patient history, and epidemiological information. If result is POSITIVE SARS-CoV-2 target nucleic acids are DETECTED. The SAR S-CoV-2 RNA is generally detectable in upper and lower  respiratory specimens during the acute phase of infection.  Positive  results are indicative of active infection with SARS-CoV-2.  Clinical  correlation with patient history and other diagnostic information is  necessary to determine patient infection status.  Positive results do  not rule out bacterial infection or co-infection with other viruses. If result is PRESUMPTIVE POSTIVE SARS-CoV-2 nucleic acids MAY BE PRESENT.   A presumptive positive result was obtained on the submitted specimen  and confirmed on repeat testing.  While 2019 novel coronavirus  (SARS-CoV-2) nucleic acids may be present in the submitted sample  additional confirmatory testing may be necessary for epidemiological  and / or clinical management  purposes  to differentiate between  SARS-CoV-2 and other Sarbecovirus currently known to infect humans.  If clinically indicated additional testing with an alternate test  methodology [phone]) is advis ed. The SARS-CoV-2 RNA is generally  detectable in upper and lower respiratory specimens during the acute  phase of infection. The expected result is Negative. Fact Sheet for Patients:  StrictlyIdeas.no Fact Sheet for Healthcare Providers: BankingDealers.co.za This test is not yet approved or cleared by the [place] FDA and has been authorized for detection and/or diagnosis of SARS-CoV-2 by FDA under an Emergency Use Authorization (EUA).  This EUA will remain in effect (meaning this test can be used) for the duration of the COVID-19 declaration under Section 564(b)(1) of the Act, 21 U.S.C. section 360bbb-3(b)(1), unless the authorization is terminated or revoked sooner. Performed at [facility], [place] [address], [place], [place] [address]   Blood Culture (routine x 2)     Status: None (Preliminary result)   Collection Time: 11/29/18 10:20 PM   Specimen: BLOOD RIGHT HAND  Result Value Ref Range Status   Specimen Description BLOOD RIGHT HAND  Final   Special Requests   Final    BOTTLES DRAWN AEROBIC AND ANAEROBIC Blood Culture adequate volume   Culture   Final    NO GROWTH 4 DAYS Performed at [facility], [place] [address], [place], [place] [address]    Report Status PENDING  Incomplete  Blood Culture (routine x 2)     Status: None (Preliminary result)   Collection Time: 11/29/18 10:31 PM   Specimen: BLOOD LEFT HAND  Result Value Ref Range Status   Specimen Description BLOOD LEFT HAND  Final   Special Requests   Final    BOTTLES DRAWN AEROBIC ONLY Blood Culture results may not be optimal due to an inadequate volume of blood received in culture bottles   Culture   Final    NO GROWTH 4 DAYS Performed at [facility], [place] [address] [place], [place] [address]    Report Status PENDING  Incomplete    Radiology Reports Dg Chest Port 1 View  Result Date: 11/30/2018 CLINICAL DATA:  63 year old female with history of shortness of breath. Multiple family contacts positive for COVID-19. EXAM: PORTABLE CHEST 1 VIEW COMPARISON:  Chest x-ray 11/29/2018. FINDINGS: Lung volumes are low. Diffuse peribronchial cuffing. Widespread areas of ground-glass attenuation most evident throughout the mid to lower lungs bilaterally (right greater  than left) with some associated septal thickening, increased compared to the prior examination. No definite pleural effusions. No evidence of pulmonary edema. Heart size is normal. The patient is rotated to the left on today's exam, resulting in distortion of the mediastinal contours and reduced diagnostic sensitivity and specificity for mediastinal pathology. IMPRESSION: 1. The appearance the chest is concerning for progressive multilobar pneumonia, as above. Electronically Signed   By: Vinnie Langton M.D.   On: 11/30/2018 08:18   Dg Chest Portable 1 View  Result Date: 11/29/2018 CLINICAL DATA:  63 year old female with shortness of breath. EXAM: PORTABLE CHEST 1 VIEW COMPARISON:  None. FINDINGS: Background of emphysema and chronic interstitial coarsening. Left lung base linear atelectasis/scarring. No focal consolidation, pleural effusion, or pneumothorax. The cardiac silhouette is within normal limits. No acute osseous pathology. IMPRESSION: No active disease. Electronically Signed   By: Anner Crete M.D.   On: 11/29/2018 22:15

## 2018-12-04 LAB — CBC WITH DIFFERENTIAL/PLATELET
Abs Immature Granulocytes: 0.68 10*3/uL — ABNORMAL HIGH (ref 0.00–0.07)
Basophils Absolute: 0.1 10*3/uL (ref 0.0–0.1)
Basophils Relative: 1 %
Eosinophils Absolute: 0 10*3/uL (ref 0.0–0.5)
Eosinophils Relative: 0 %
HCT: 33.1 % — ABNORMAL LOW (ref 36.0–46.0)
Hemoglobin: 10.2 g/dL — ABNORMAL LOW (ref 12.0–15.0)
Immature Granulocytes: 6 %
Lymphocytes Relative: 16 %
Lymphs Abs: 1.8 10*3/uL (ref 0.7–4.0)
MCH: 25.1 pg — ABNORMAL LOW (ref 26.0–34.0)
MCHC: 30.8 g/dL (ref 30.0–36.0)
MCV: 81.3 fL (ref 80.0–100.0)
Monocytes Absolute: 0.7 10*3/uL (ref 0.1–1.0)
Monocytes Relative: 6 %
Neutro Abs: 8.1 10*3/uL — ABNORMAL HIGH (ref 1.7–7.7)
Neutrophils Relative %: 71 %
Platelets: 603 10*3/uL — ABNORMAL HIGH (ref 150–400)
RBC: 4.07 MIL/uL (ref 3.87–5.11)
RDW: 14.4 % (ref 11.5–15.5)
WBC: 11.3 10*3/uL — ABNORMAL HIGH (ref 4.0–10.5)
nRBC: 3.9 % — ABNORMAL HIGH (ref 0.0–0.2)

## 2018-12-04 LAB — COMPREHENSIVE METABOLIC PANEL
ALT: 34 U/L (ref 0–44)
AST: 35 U/L (ref 15–41)
Albumin: 2.5 g/dL — ABNORMAL LOW (ref 3.5–5.0)
Alkaline Phosphatase: 73 U/L (ref 38–126)
Anion gap: 11 (ref 5–15)
BUN: 27 mg/dL — ABNORMAL HIGH (ref 8–23)
CO2: 26 mmol/L (ref 22–32)
Calcium: 8.9 mg/dL (ref 8.9–10.3)
Chloride: 104 mmol/L (ref 98–111)
Creatinine, Ser: 0.88 mg/dL (ref 0.44–1.00)
GFR calc Af Amer: >60 mL/min (ref >60)
GFR calc non Af Amer: >60 mL/min (ref >60)
Glucose, Bld: 108 mg/dL — ABNORMAL HIGH (ref 70–99)
Potassium: 4.7 mmol/L (ref 3.5–5.1)
Sodium: 141 mmol/L (ref 135–145)
Total Bilirubin: 0.6 mg/dL (ref 0.3–1.2)
Total Protein: 6.1 g/dL — ABNORMAL LOW (ref 6.5–8.1)

## 2018-12-04 LAB — CULTURE, BLOOD (ROUTINE X 2)
Culture: NO GROWTH
Culture: NO GROWTH
Special Requests: ADEQUATE

## 2018-12-04 LAB — C-REACTIVE PROTEIN: CRP: 6.1 mg/dL — ABNORMAL HIGH (ref <1.0)

## 2018-12-04 LAB — FERRITIN: Ferritin: 124 ng/mL (ref 11–307)

## 2018-12-04 LAB — LACTATE DEHYDROGENASE: LDH: 299 U/L — ABNORMAL HIGH (ref 98–192)

## 2018-12-04 LAB — MAGNESIUM: Magnesium: 2.1 mg/dL (ref 1.7–2.4)

## 2018-12-04 LAB — D-DIMER, QUANTITATIVE: D-Dimer, Quant: 1.37 ug/mL-FEU — ABNORMAL HIGH (ref 0.00–0.50)

## 2018-12-04 LAB — BRAIN NATRIURETIC PEPTIDE: B Natriuretic Peptide: 62.8 pg/mL (ref 0.0–100.0)

## 2018-12-04 MED ORDER — ALBUTEROL SULFATE HFA 108 (90 BASE) MCG/ACT IN AERS: 2.0000 | INHALATION_SPRAY | Freq: Four times a day (QID) | RESPIRATORY_TRACT | 0 refills | Status: AC | PRN

## 2018-12-04 MED ORDER — PREDNISONE 5 MG PO TABS: ORAL_TABLET | ORAL | 0 refills | Status: DC

## 2018-12-04 NOTE — Discharge Summary (Signed)
Nichole Hanson [id number]:[id number] DOB: 01-19-1956 DOA: 11/29/2018  PCP: Donald Prose, MD  Admit date: 11/29/2018  Discharge date: 12/04/2018  Admitted From: Home   Disposition:  Home   Recommendations for Outpatient Follow-up:   Follow up with PCP in 1-2 weeks  PCP Please obtain BMP/CBC, 2 view CXR in 1week,  (see Discharge instructions)   PCP Please follow up on the following pending results:    Home Health: None   Equipment/Devices: 1lit o2  Consultations: None  Discharge Condition: Stable    CODE STATUS: Full    Diet Recommendation: Heart Healthy     Chief Complaint  Patient presents with  . Shortness of Breath     Brief history of present illness from the day of admission and additional interim summary    63 year old female with a history of asthma (only takes as needed Zyrtec), CKD stage II, tobacco abuse, hypertension presented with shortness of breath, cough, fevers and chills for almost 10 days. Worsened on the day of admission. No chest pain, palpitations, reported nausea but no vomiting. No abdominal pain or diarrhea. Reportedly her family member was tested positive for COVID recently.  In the ER she was diagnosed with COVID-19 infection and admitted.                                                                 Hospital Course    1. Acute Hypoxic Resp. Failure due to Acute Covid 19 Viral Pneumonitis during the ongoing 2020 Covid 19 Pandemic - initially patient was on room air, she subsequently developed more hypoxia and required 3-4 L nasal cannula oxygen, chest x-ray progressed from stable to bilateral bibasilar infiltrates, she was also febrile and CRP was high.  She was started on appropriate treatment immediately which included IV steroids, Remdisvir and a dose of Actemra on the day of admission.  She is now feeling much better, less hypoxic, down to 1-2 L nasal cannula oxygen.  Inflammatory markers finally trending down.    She is now symptom-free and down to 1 L nasal cannula oxygen with pulse ox in mid 90s, she wants to go home and will be discharged on oral steroid taper, 1 to 2 L nasal cannula oxygen which I think can come off in the next week when she follows with PCP inflammatory markers are stable and she is completely symptom-free at this time.Marland Kitchen  COVID-19 Labs  Recent Labs    12/02/18 0415 12/03/18 0240 12/04/18 0220  DDIMER 1.26* 1.10* 1.37*  FERRITIN 256 164 124  LDH 343* 304* 299*  CRP 19.8* 9.6* 6.1*    Lab Results  Component Value Date   SARSCOV2NAA POSITIVE (A) 11/29/2018    2.  Smoking.  Counseled to quit.  On NicoDerm patch.  3.  Hypertension.  Blood pressure stable  HCTZ on hold will add low-dose IV hydralazine as needed.  4.  History of chronic depression.  Stable on Wellbutrin.  5.  CKD 2.  At baseline.  6.  Hypokalemia.  Replaced.  7.  History of RA.  Likely moderate to severe disease.  Baseline CRP likely slightly higher.  Outpatient follow-up no acute issues.    Discharge diagnosis     Principal Problem:   Acute respiratory disease due to COVID-19 virus Active Problems:   Tobacco abuse   CKD (chronic kidney disease), stage II   HTN (hypertension)   Depression   Acute respiratory failure with hypoxia [facility])    Discharge instructions    Discharge Instructions    Diet - low sodium heart healthy   Complete by: As directed    Discharge instructions   Complete by: As directed    Follow with Primary MD Donald Prose, MD in 7 days   Get CBC, CMP, 2 view Chest X ray -  checked next visit within 1 week by Primary MD    Activity: As tolerated with Full fall precautions use walker/cane & assistance as needed  Disposition Home   Diet: Heart Healthy    Special Instructions: If you have smoked or chewed Tobacco  in the last 2 yrs  please stop smoking, stop any regular Alcohol  and or any Recreational drug use.  On your next visit with your primary care physician please Get Medicines reviewed and adjusted.  Please request your Prim.MD to go over all Hospital Tests and Procedure/Radiological results at the follow up, please get all Hospital records sent to your Prim MD by signing hospital release before you go home.  If you experience worsening of your admission symptoms, develop shortness of breath, life threatening emergency, suicidal or homicidal thoughts you must seek medical attention immediately by calling 911 or calling your MD immediately  if symptoms less severe.  You Must read complete instructions/literature along with all the possible adverse reactions/side effects for all the Medicines you take and that have been prescribed to you. Take any new Medicines after you have completely understood and accpet all the possible adverse reactions/side effects.   Increase activity slowly   Complete by: As directed       Discharge Medications   Allergies as of 12/04/2018      Reactions   Asa Arthritis Strength-antacid [aspirin Buffered] Other (See Comments)   Tingling on one side body-PATIENT DENIES THIS IS AN ALLERGY      Medication List    STOP taking these medications   naproxen sodium 220 MG tablet Commonly known as: ALEVE     TAKE these medications   albuterol 108 (90 Base) MCG/ACT inhaler Commonly known as: VENTOLIN HFA Inhale 2 puffs into the lungs every 6 (six) hours as needed for wheezing or shortness of breath.   ESTROVEN PO Take 1 tablet by mouth at bedtime.   hydrochlorothiazide 12.5 MG tablet Commonly known as: HYDRODIURIL Take 12.5 mg by mouth daily.   predniSONE 5 MG tablet Commonly known as: DELTASONE Label  & dispense according to the schedule below. 10 Pills PO for 3 days then, 8 Pills PO for 3 days, 6 Pills PO for 3 days, 4 Pills PO for 3 days, 2 Pills PO for 3 days, 1 Pills PO for 3  days, 1/2 Pill  PO for 3 days then STOP. Total 95 pills.   terbinafine 250 MG tablet Commonly known as: LAMISIL Take 250 mg by mouth daily.  Wellbutrin XL 150 MG 24 hr tablet Generic drug: buPROPion Take 150 mg by mouth daily.            Durable Medical Equipment  (From admission, onward)         Start     Ordered   12/03/18 1254  For home use only DME oxygen  Once    Question Answer Comment  Length of Need 6 Months   Mode or (Route) Nasal cannula   Liters per Minute 2   Frequency Continuous (stationary and portable oxygen unit needed)   Oxygen conserving device Yes   Oxygen delivery system Gas      12/03/18 [place] Follow up.   Why: A representative from [employer] will deliver oxygen concentrator and tanks to your home. Should you have any problems with them, please call [employer]. Contact information: [place] [address] [phone]        Donald Prose, MD. Schedule an appointment as soon as possible for a visit in 1 week(s).   Specialty: Family Medicine Contact information: [place] [address] [phone]           Major procedures and Radiology Reports - PLEASE review detailed and final reports thoroughly  -       Dg Chest Port 1 View  Result Date: 11/30/2018 CLINICAL DATA:  63 year old female with history of shortness of breath. Multiple family contacts positive for COVID-19. EXAM: PORTABLE CHEST 1 VIEW COMPARISON:  Chest x-ray 11/29/2018. FINDINGS: Lung volumes are low. Diffuse peribronchial cuffing. Widespread areas of ground-glass attenuation most evident throughout the mid to lower lungs bilaterally (right greater than left) with some associated septal thickening, increased compared to the prior examination. No definite pleural effusions. No evidence of pulmonary edema. Heart size is normal. The patient is rotated to the left on  today's exam, resulting in distortion of the mediastinal contours and reduced diagnostic sensitivity and specificity for mediastinal pathology. IMPRESSION: 1. The appearance the chest is concerning for progressive multilobar pneumonia, as above. Electronically Signed   By: Vinnie Langton M.D.   On: 11/30/2018 08:18   Dg Chest Portable 1 View  Result Date: 11/29/2018 CLINICAL DATA:  63 year old female with shortness of breath. EXAM: PORTABLE CHEST 1 VIEW COMPARISON:  None. FINDINGS: Background of emphysema and chronic interstitial coarsening. Left lung base linear atelectasis/scarring. No focal consolidation, pleural effusion, or pneumothorax. The cardiac silhouette is within normal limits. No acute osseous pathology. IMPRESSION: No active disease. Electronically Signed   By: Anner Crete M.D.   On: 11/29/2018 22:15    Micro Results     Recent Results (from the past 240 hour(s))  SARS Coronavirus 2 [facility] order, Performed in [place] hospital lab)     Status: Abnormal   Collection Time: 11/29/18 10:08 PM   Specimen: Nasopharyngeal Swab  Result Value Ref Range Status   SARS Coronavirus 2 POSITIVE (A) NEGATIVE Final    Comment: RESULT CALLED TO, READ BACK BY AND VERIFIED WITH: Bennie Hind RN 11/29/18 2323 JDW (NOTE) If result is NEGATIVE SARS-CoV-2 target nucleic acids are NOT DETECTED. The SARS-CoV-2 RNA is generally detectable in upper and lower  respiratory specimens during the acute phase of infection. The lowest  concentration of SARS-CoV-2 viral copies this assay can detect is 250  copies / mL. A negative result does not preclude SARS-CoV-2 infection  and should not  be used as the sole basis for treatment or other  patient management decisions.  A negative result may occur with  improper specimen collection / handling, submission of specimen other  than nasopharyngeal swab, presence of viral mutation(s) within the  areas targeted by this assay, and inadequate number of viral  copies  (<250 copies / mL). A negative result must be combined with clinical  observations, patient history, and epidemiological information. If result is POSITIVE SARS-CoV-2 target nucleic acids are DETECTED. The SAR S-CoV-2 RNA is generally detectable in upper and lower  respiratory specimens during the acute phase of infection.  Positive  results are indicative of active infection with SARS-CoV-2.  Clinical  correlation with patient history and other diagnostic information is  necessary to determine patient infection status.  Positive results do  not rule out bacterial infection or co-infection with other viruses. If result is PRESUMPTIVE POSTIVE SARS-CoV-2 nucleic acids MAY BE PRESENT.   A presumptive positive result was obtained on the submitted specimen  and confirmed on repeat testing.  While 2019 novel coronavirus  (SARS-CoV-2) nucleic acids may be present in the submitted sample  additional confirmatory testing may be necessary for epidemiological  and / or clinical management purposes  to differentiate between  SARS-CoV-2 and other Sarbecovirus currently known to infect humans.  If clinically indicated additional testing with an alternate test  methodology [phone]) is advis ed. The SARS-CoV-2 RNA is generally  detectable in upper and lower respiratory specimens during the acute  phase of infection. The expected result is Negative. Fact Sheet for Patients:  StrictlyIdeas.no Fact Sheet for Healthcare Providers: BankingDealers.co.za This test is not yet approved or cleared by the [place] FDA and has been authorized for detection and/or diagnosis of SARS-CoV-2 by FDA under an Emergency Use Authorization (EUA).  This EUA will remain in effect (meaning this test can be used) for the duration of the COVID-19 declaration under Section 564(b)(1) of the Act, 21 U.S.C. section 360bbb-3(b)(1), unless the authorization is terminated  or revoked sooner. Performed at [facility], [place] [address], [place], [place] [address]   Blood Culture (routine x 2)     Status: None   Collection Time: 11/29/18 10:20 PM   Specimen: BLOOD RIGHT HAND  Result Value Ref Range Status   Specimen Description BLOOD RIGHT HAND  Final   Special Requests   Final    BOTTLES DRAWN AEROBIC AND ANAEROBIC Blood Culture adequate volume   Culture   Final    NO GROWTH 5 DAYS Performed at [facility], [place] [address] [place], [place] [address]    Report Status 12/04/2018 FINAL  Final  Blood Culture (routine x 2)     Status: None   Collection Time: 11/29/18 10:31 PM   Specimen: BLOOD LEFT HAND  Result Value Ref Range Status   Specimen Description BLOOD LEFT HAND  Final   Special Requests   Final    BOTTLES DRAWN AEROBIC ONLY Blood Culture results may not be optimal due to an inadequate volume of blood received in culture bottles   Culture   Final    NO GROWTH 5 DAYS Performed at [facility], [place] [address] [place], [place] [address]    Report Status 12/04/2018 FINAL  Final    Today   Subjective    Brandolyn Decandia today has no headache,no chest abdominal pain,no new weakness tingling or numbness, feels much better wants to go home today.     Objective  Blood pressure 139/82, pulse 79, temperature 98.3 F (36.8 C), temperature source Oral, resp. rate 16, height 5\' 4"  (1.626 m), weight 59 kg, SpO2 90 %.   Intake/Output Summary (Last 24 hours) at 12/04/2018 0915 Last data filed at 12/03/2018 1340 Gross per 24 hour  Intake 730 ml  Output -  Net 730 ml    Exam  Awake Alert, Oriented x 3, No new F.N deficits, Normal affect [place].AT,PERRAL Supple Neck,No JVD, No cervical lymphadenopathy appriciated.  Symmetrical Chest wall movement, Good air movement bilaterally, CTAB RRR,No Gallops,Rubs or new Murmurs, No Parasternal Heave +ve B.Sounds, Abd Soft, Non tender, No organomegaly appriciated, No rebound -guarding or  rigidity. No Cyanosis, Clubbing or edema, No new Rash or bruise   Data Review   CBC w Diff:  Lab Results  Component Value Date   WBC 11.3 (H) 12/04/2018   HGB 10.2 (L) 12/04/2018   HCT 33.1 (L) 12/04/2018   PLT 603 (H) 12/04/2018   LYMPHOPCT 16 12/04/2018   MONOPCT 6 12/04/2018   EOSPCT 0 12/04/2018   BASOPCT 1 12/04/2018    CMP:  Lab Results  Component Value Date   NA 141 12/04/2018   K 4.7 12/04/2018   CL 104 12/04/2018   CO2 26 12/04/2018   BUN 27 (H) 12/04/2018   CREATININE 0.88 12/04/2018   PROT 6.1 (L) 12/04/2018   ALBUMIN 2.5 (L) 12/04/2018   BILITOT 0.6 12/04/2018   ALKPHOS 73 12/04/2018   AST 35 12/04/2018   ALT 34 12/04/2018  .   Total Time in preparing paper work, data evaluation and todays exam - 26 minutes  Lala Lund M.D on 12/04/2018 at [place]  [phone]

## 2018-12-04 NOTE — Discharge Instructions (Signed)
Follow with Primary MD Donald Prose, MD in 7 days   Get CBC, CMP, 2 view Chest X ray -  checked next visit within 1 week by Primary MD    Activity: As tolerated with Full fall precautions use walker/cane & assistance as needed  Disposition Home   Diet: Heart Healthy    Special Instructions: If you have smoked or chewed Tobacco  in the last 2 yrs please stop smoking, stop any regular Alcohol  and or any Recreational drug use.  On your next visit with your primary care physician please Get Medicines reviewed and adjusted.  Please request your Prim.MD to go over all Hospital Tests and Procedure/Radiological results at the follow up, please get all Hospital records sent to your Prim MD by signing hospital release before you go home.  If you experience worsening of your admission symptoms, develop shortness of breath, life threatening emergency, suicidal or homicidal thoughts you must seek medical attention immediately by calling 911 or calling your MD immediately  if symptoms less severe.  You Must read complete instructions/literature along with all the possible adverse reactions/side effects for all the Medicines you take and that have been prescribed to you. Take any new Medicines after you have completely understood and accpet all the possible adverse reactions/side effects.       Person Under Monitoring Name: Nichole Hanson  Location: [address] [place] [address]   Infection Prevention Recommendations for Individuals Confirmed to have, or Being Evaluated for, 2019 Novel Coronavirus (COVID-19) Infection Who Receive Care at Home  Individuals who are confirmed to have, or are being evaluated for, COVID-19 should follow the prevention steps below until a healthcare provider or local or state health department says they can return to normal activities.  Stay home except to get medical care You should restrict activities outside your home, except for getting medical  care. Do not go to work, school, or public areas, and do not use public transportation or taxis.  Call ahead before visiting your doctor Before your medical appointment, call the healthcare provider and tell them that you have, or are being evaluated for, COVID-19 infection. This will help the healthcare providers office take steps to keep other people from getting infected. Ask your healthcare provider to call the local or state health department.  Monitor your symptoms Seek prompt medical attention if your illness is worsening (e.g., difficulty breathing). Before going to your medical appointment, call the healthcare provider and tell them that you have, or are being evaluated for, COVID-19 infection. Ask your healthcare provider to call the local or state health department.  Wear a facemask You should wear a facemask that covers your nose and mouth when you are in the same room with other people and when you visit a healthcare provider. People who live with or visit you should also wear a facemask while they are in the same room with you.  Separate yourself from other people in your home As much as possible, you should stay in a different room from other people in your home. Also, you should use a separate bathroom, if available.  Avoid sharing household items You should not share dishes, drinking glasses, cups, eating utensils, towels, bedding, or other items with other people in your home. After using these items, you should wash them thoroughly with soap and water.  Cover your coughs and sneezes Cover your mouth and nose with a tissue when you cough or sneeze, or you can cough  or sneeze into your sleeve. Throw used tissues in a lined trash can, and immediately wash your hands with soap and water for at least 20 seconds or use an alcohol-based hand rub.  Wash your [facility] your hands often and thoroughly with soap and water for at least 20 seconds. You can use an alcohol-based  hand sanitizer if soap and water are not available and if your hands are not visibly dirty. Avoid touching your eyes, nose, and mouth with unwashed hands.   Prevention Steps for Caregivers and Household Members of Individuals Confirmed to have, or Being Evaluated for, COVID-19 Infection Being Cared for in the Home  If you live with, or provide care at home for, a person confirmed to have, or being evaluated for, COVID-19 infection please follow these guidelines to prevent infection:  Follow healthcare providers instructions Make sure that you understand and can help the patient follow any healthcare provider instructions for all care.  Provide for the patients basic needs You should help the patient with basic needs in the home and provide support for getting groceries, prescriptions, and other personal needs.  Monitor the patients symptoms If they are getting sicker, call his or her medical provider and tell them that the patient has, or is being evaluated for, COVID-19 infection. This will help the healthcare providers office take steps to keep other people from getting infected. Ask the healthcare provider to call the local or state health department.  Limit the number of people who have contact with the patient  If possible, have only one caregiver for the patient.  Other household members should stay in another home or place of residence. If this is not possible, they should stay  in another room, or be separated from the patient as much as possible. Use a separate bathroom, if available.  Restrict visitors who do not have an essential need to be in the home.  Keep older adults, very young children, and other sick people away from the patient Keep older adults, very young children, and those who have compromised immune systems or chronic health conditions away from the patient. This includes people with chronic heart, lung, or kidney conditions, diabetes, and  cancer.  Ensure good ventilation Make sure that shared spaces in the home have good air flow, such as from an air conditioner or an opened window, weather permitting.  Wash your hands often  Wash your hands often and thoroughly with soap and water for at least 20 seconds. You can use an alcohol based hand sanitizer if soap and water are not available and if your hands are not visibly dirty.  Avoid touching your eyes, nose, and mouth with unwashed hands.  Use disposable paper towels to dry your hands. If not available, use dedicated cloth towels and replace them when they become wet.  Wear a facemask and gloves  Wear a disposable facemask at all times in the room and gloves when you touch or have contact with the patients blood, body fluids, and/or secretions or excretions, such as sweat, saliva, sputum, nasal mucus, vomit, urine, or feces.  Ensure the mask fits over your nose and mouth tightly, and do not touch it during use.  Throw out disposable facemasks and gloves after using them. Do not reuse.  Wash your hands immediately after removing your facemask and gloves.  If your personal clothing becomes contaminated, carefully remove clothing and launder. Wash your hands after handling contaminated clothing.  Place all used disposable facemasks, gloves, and other  waste in a lined container before disposing them with other household waste.  Remove gloves and wash your hands immediately after handling these items.  Do not share dishes, glasses, or other household items with the patient  Avoid sharing household items. You should not share dishes, drinking glasses, cups, eating utensils, towels, bedding, or other items with a patient who is confirmed to have, or being evaluated for, COVID-19 infection.  After the person uses these items, you should wash them thoroughly with soap and water.  Wash laundry thoroughly  Immediately remove and wash clothes or bedding that have blood, body  fluids, and/or secretions or excretions, such as sweat, saliva, sputum, nasal mucus, vomit, urine, or feces, on them.  Wear gloves when handling laundry from the patient.  Read and follow directions on labels of laundry or clothing items and detergent. In general, wash and dry with the warmest temperatures recommended on the label.  Clean all areas the individual has used often  Clean all touchable surfaces, such as counters, tabletops, doorknobs, bathroom fixtures, toilets, phones, keyboards, tablets, and bedside tables, every day. Also, clean any surfaces that may have blood, body fluids, and/or secretions or excretions on them.  Wear gloves when cleaning surfaces the patient has come in contact with.  Use a diluted bleach solution (e.g., dilute bleach with 1 part bleach and 10 parts water) or a household disinfectant with a label that says EPA-registered for coronaviruses. To make a bleach solution at home, add 1 tablespoon of bleach to 1 quart (4 cups) of water. For a larger supply, add  cup of bleach to 1 gallon (16 cups) of water.  Read labels of cleaning products and follow recommendations provided on product labels. Labels contain instructions for safe and effective use of the cleaning product including precautions you should take when applying the product, such as wearing gloves or eye protection and making sure you have good ventilation during use of the product.  Remove gloves and wash hands immediately after cleaning.  Monitor yourself for signs and symptoms of illness Caregivers and household members are considered close contacts, should monitor their health, and will be asked to limit movement outside of the home to the extent possible. Follow the monitoring steps for close contacts listed on the symptom monitoring form.   ? If you have additional questions, contact your local health department or call the epidemiologist on call at [phone] (available 24/7). ? This  guidance is subject to change. For the most up-to-date guidance from [facility], please refer to their website: YouBlogs.pl

## 2019-01-23 DIAGNOSIS — N898 Other specified noninflammatory disorders of vagina: Secondary | ICD-10-CM | POA: Diagnosis not present

## 2019-01-23 DIAGNOSIS — M069 Rheumatoid arthritis, unspecified: Secondary | ICD-10-CM | POA: Diagnosis not present

## 2019-01-23 DIAGNOSIS — G8929 Other chronic pain: Secondary | ICD-10-CM | POA: Diagnosis not present

## 2019-01-23 DIAGNOSIS — M545 Low back pain: Secondary | ICD-10-CM | POA: Diagnosis not present

## 2019-02-17 DIAGNOSIS — M4184 Other forms of scoliosis, thoracic region: Secondary | ICD-10-CM | POA: Diagnosis not present

## 2019-02-17 DIAGNOSIS — M064 Inflammatory polyarthropathy: Secondary | ICD-10-CM | POA: Diagnosis not present

## 2019-02-17 DIAGNOSIS — M1990 Unspecified osteoarthritis, unspecified site: Secondary | ICD-10-CM | POA: Diagnosis not present

## 2019-02-17 DIAGNOSIS — M25519 Pain in unspecified shoulder: Secondary | ICD-10-CM | POA: Diagnosis not present

## 2019-02-17 DIAGNOSIS — M19071 Primary osteoarthritis, right ankle and foot: Secondary | ICD-10-CM | POA: Diagnosis not present

## 2019-02-17 DIAGNOSIS — M25559 Pain in unspecified hip: Secondary | ICD-10-CM | POA: Diagnosis not present

## 2019-02-17 DIAGNOSIS — M160 Bilateral primary osteoarthritis of hip: Secondary | ICD-10-CM | POA: Diagnosis not present

## 2019-02-17 DIAGNOSIS — M19041 Primary osteoarthritis, right hand: Secondary | ICD-10-CM | POA: Diagnosis not present

## 2019-02-17 DIAGNOSIS — M25551 Pain in right hip: Secondary | ICD-10-CM | POA: Diagnosis not present

## 2019-02-17 DIAGNOSIS — M79672 Pain in left foot: Secondary | ICD-10-CM | POA: Diagnosis not present

## 2019-02-17 DIAGNOSIS — M79671 Pain in right foot: Secondary | ICD-10-CM | POA: Diagnosis not present

## 2019-02-17 DIAGNOSIS — M47814 Spondylosis without myelopathy or radiculopathy, thoracic region: Secondary | ICD-10-CM | POA: Diagnosis not present

## 2019-02-17 DIAGNOSIS — M7060 Trochanteric bursitis, unspecified hip: Secondary | ICD-10-CM | POA: Diagnosis not present

## 2019-02-17 DIAGNOSIS — R768 Other specified abnormal immunological findings in serum: Secondary | ICD-10-CM | POA: Diagnosis not present

## 2019-02-17 DIAGNOSIS — M79642 Pain in left hand: Secondary | ICD-10-CM | POA: Diagnosis not present

## 2019-02-17 DIAGNOSIS — M79641 Pain in right hand: Secondary | ICD-10-CM | POA: Diagnosis not present

## 2019-02-17 DIAGNOSIS — M19072 Primary osteoarthritis, left ankle and foot: Secondary | ICD-10-CM | POA: Diagnosis not present

## 2019-02-17 DIAGNOSIS — M545 Low back pain: Secondary | ICD-10-CM | POA: Diagnosis not present

## 2019-02-17 DIAGNOSIS — M19042 Primary osteoarthritis, left hand: Secondary | ICD-10-CM | POA: Diagnosis not present

## 2019-03-03 DIAGNOSIS — Z1211 Encounter for screening for malignant neoplasm of colon: Secondary | ICD-10-CM | POA: Diagnosis not present

## 2019-03-03 DIAGNOSIS — J449 Chronic obstructive pulmonary disease, unspecified: Secondary | ICD-10-CM | POA: Diagnosis not present

## 2019-03-03 DIAGNOSIS — I10 Essential (primary) hypertension: Secondary | ICD-10-CM | POA: Diagnosis not present

## 2019-03-03 DIAGNOSIS — Z1389 Encounter for screening for other disorder: Secondary | ICD-10-CM | POA: Diagnosis not present

## 2019-03-03 DIAGNOSIS — F17200 Nicotine dependence, unspecified, uncomplicated: Secondary | ICD-10-CM | POA: Diagnosis not present

## 2019-03-04 DIAGNOSIS — R768 Other specified abnormal immunological findings in serum: Secondary | ICD-10-CM | POA: Diagnosis not present

## 2019-03-04 DIAGNOSIS — M25559 Pain in unspecified hip: Secondary | ICD-10-CM | POA: Diagnosis not present

## 2019-03-04 DIAGNOSIS — M1990 Unspecified osteoarthritis, unspecified site: Secondary | ICD-10-CM | POA: Diagnosis not present

## 2019-03-04 DIAGNOSIS — M7060 Trochanteric bursitis, unspecified hip: Secondary | ICD-10-CM | POA: Diagnosis not present

## 2019-03-04 DIAGNOSIS — M064 Inflammatory polyarthropathy: Secondary | ICD-10-CM | POA: Diagnosis not present

## 2019-03-04 DIAGNOSIS — M25519 Pain in unspecified shoulder: Secondary | ICD-10-CM | POA: Diagnosis not present

## 2019-03-04 DIAGNOSIS — M419 Scoliosis, unspecified: Secondary | ICD-10-CM | POA: Diagnosis not present

## 2019-03-26 DIAGNOSIS — Z1211 Encounter for screening for malignant neoplasm of colon: Secondary | ICD-10-CM | POA: Diagnosis not present

## 2019-07-29 DIAGNOSIS — H524 Presbyopia: Secondary | ICD-10-CM | POA: Diagnosis not present

## 2019-07-29 DIAGNOSIS — H5203 Hypermetropia, bilateral: Secondary | ICD-10-CM | POA: Diagnosis not present

## 2019-07-29 DIAGNOSIS — H52223 Regular astigmatism, bilateral: Secondary | ICD-10-CM | POA: Diagnosis not present

## 2019-10-22 DIAGNOSIS — F3341 Major depressive disorder, recurrent, in partial remission: Secondary | ICD-10-CM | POA: Diagnosis not present

## 2019-10-22 DIAGNOSIS — I10 Essential (primary) hypertension: Secondary | ICD-10-CM | POA: Diagnosis not present

## 2019-10-28 DIAGNOSIS — H9193 Unspecified hearing loss, bilateral: Secondary | ICD-10-CM | POA: Diagnosis not present

## 2019-10-28 DIAGNOSIS — H9313 Tinnitus, bilateral: Secondary | ICD-10-CM | POA: Diagnosis not present

## 2019-12-16 DIAGNOSIS — E7800 Pure hypercholesterolemia, unspecified: Secondary | ICD-10-CM | POA: Diagnosis not present

## 2020-02-11 DIAGNOSIS — J019 Acute sinusitis, unspecified: Secondary | ICD-10-CM | POA: Diagnosis not present

## 2020-02-11 DIAGNOSIS — R040 Epistaxis: Secondary | ICD-10-CM | POA: Diagnosis not present

## 2020-02-17 DIAGNOSIS — J449 Chronic obstructive pulmonary disease, unspecified: Secondary | ICD-10-CM | POA: Diagnosis not present

## 2020-02-17 DIAGNOSIS — Z0000 Encounter for general adult medical examination without abnormal findings: Secondary | ICD-10-CM | POA: Diagnosis not present

## 2020-02-17 DIAGNOSIS — I10 Essential (primary) hypertension: Secondary | ICD-10-CM | POA: Diagnosis not present

## 2020-02-17 DIAGNOSIS — F3341 Major depressive disorder, recurrent, in partial remission: Secondary | ICD-10-CM | POA: Diagnosis not present

## 2020-02-17 DIAGNOSIS — E7800 Pure hypercholesterolemia, unspecified: Secondary | ICD-10-CM | POA: Diagnosis not present

## 2020-02-17 DIAGNOSIS — Z1211 Encounter for screening for malignant neoplasm of colon: Secondary | ICD-10-CM | POA: Diagnosis not present

## 2020-02-17 DIAGNOSIS — Z01419 Encounter for gynecological examination (general) (routine) without abnormal findings: Secondary | ICD-10-CM | POA: Diagnosis not present

## 2020-02-25 DIAGNOSIS — Z1211 Encounter for screening for malignant neoplasm of colon: Secondary | ICD-10-CM | POA: Diagnosis not present

## 2020-07-13 DIAGNOSIS — H938X2 Other specified disorders of left ear: Secondary | ICD-10-CM | POA: Diagnosis not present

## 2020-09-06 ENCOUNTER — Other Ambulatory Visit: Payer: Self-pay | Admitting: Family Medicine

## 2020-09-06 DIAGNOSIS — Z1231 Encounter for screening mammogram for malignant neoplasm of breast: Secondary | ICD-10-CM

## 2020-09-06 DIAGNOSIS — R5381 Other malaise: Secondary | ICD-10-CM

## 2020-09-20 DIAGNOSIS — Z1231 Encounter for screening mammogram for malignant neoplasm of breast: Secondary | ICD-10-CM | POA: Diagnosis not present

## 2020-09-20 DIAGNOSIS — E7800 Pure hypercholesterolemia, unspecified: Secondary | ICD-10-CM | POA: Diagnosis not present

## 2020-09-20 DIAGNOSIS — Z5320 Procedure and treatment not carried out because of patient's decision for unspecified reasons: Secondary | ICD-10-CM | POA: Diagnosis not present

## 2020-09-20 DIAGNOSIS — Z1389 Encounter for screening for other disorder: Secondary | ICD-10-CM | POA: Diagnosis not present

## 2020-09-20 DIAGNOSIS — F3341 Major depressive disorder, recurrent, in partial remission: Secondary | ICD-10-CM | POA: Diagnosis not present

## 2020-09-20 DIAGNOSIS — I10 Essential (primary) hypertension: Secondary | ICD-10-CM | POA: Diagnosis not present

## 2020-09-20 DIAGNOSIS — J449 Chronic obstructive pulmonary disease, unspecified: Secondary | ICD-10-CM | POA: Diagnosis not present

## 2020-09-22 ENCOUNTER — Other Ambulatory Visit: Payer: Self-pay | Admitting: Family Medicine

## 2020-09-22 DIAGNOSIS — E2839 Other primary ovarian failure: Secondary | ICD-10-CM

## 2020-10-25 ENCOUNTER — Ambulatory Visit: Payer: Self-pay

## 2021-03-16 DIAGNOSIS — Z1211 Encounter for screening for malignant neoplasm of colon: Secondary | ICD-10-CM | POA: Diagnosis not present

## 2021-03-16 DIAGNOSIS — I10 Essential (primary) hypertension: Secondary | ICD-10-CM | POA: Diagnosis not present

## 2021-03-16 DIAGNOSIS — F3341 Major depressive disorder, recurrent, in partial remission: Secondary | ICD-10-CM | POA: Diagnosis not present

## 2021-03-16 DIAGNOSIS — Z0000 Encounter for general adult medical examination without abnormal findings: Secondary | ICD-10-CM | POA: Diagnosis not present

## 2021-03-24 DIAGNOSIS — Z1211 Encounter for screening for malignant neoplasm of colon: Secondary | ICD-10-CM | POA: Diagnosis not present

## 2021-04-11 ENCOUNTER — Encounter: Payer: Self-pay | Admitting: Gastroenterology

## 2021-04-11 DIAGNOSIS — H524 Presbyopia: Secondary | ICD-10-CM | POA: Diagnosis not present

## 2021-04-11 DIAGNOSIS — H5203 Hypermetropia, bilateral: Secondary | ICD-10-CM | POA: Diagnosis not present

## 2021-04-11 DIAGNOSIS — H52223 Regular astigmatism, bilateral: Secondary | ICD-10-CM | POA: Diagnosis not present

## 2021-04-18 ENCOUNTER — Ambulatory Visit
Admission: RE | Admit: 2021-04-18 | Discharge: 2021-04-18 | Disposition: A | Discharge status: Home / Self Care | Payer: Medicare HMO | Source: Ambulatory Visit | Attending: Family Medicine | Admitting: Family Medicine

## 2021-04-18 DIAGNOSIS — R5381 Other malaise: Secondary | ICD-10-CM

## 2021-04-18 DIAGNOSIS — Z780 Asymptomatic menopausal state: Secondary | ICD-10-CM | POA: Diagnosis not present

## 2021-04-18 DIAGNOSIS — M85851 Other specified disorders of bone density and structure, right thigh: Secondary | ICD-10-CM | POA: Diagnosis not present

## 2021-04-18 DIAGNOSIS — E2839 Other primary ovarian failure: Secondary | ICD-10-CM

## 2021-04-18 DIAGNOSIS — Z1231 Encounter for screening mammogram for malignant neoplasm of breast: Secondary | ICD-10-CM | POA: Diagnosis not present

## 2021-04-25 ENCOUNTER — Encounter: Payer: Self-pay | Admitting: Gastroenterology

## 2021-04-25 ENCOUNTER — Ambulatory Visit (INDEPENDENT_AMBULATORY_CARE_PROVIDER_SITE_OTHER): Payer: Medicare HMO | Admitting: Gastroenterology

## 2021-04-25 VITALS — BP 124/78 | HR 77 | Ht 64.0 in | Wt 133.0 lb

## 2021-04-25 DIAGNOSIS — R195 Other fecal abnormalities: Secondary | ICD-10-CM

## 2021-04-25 DIAGNOSIS — R002 Palpitations: Secondary | ICD-10-CM

## 2021-04-25 MED ORDER — SUTAB 1479-225-188 MG PO TABS: 24.0000 | ORAL_TABLET | Freq: Once | ORAL | 0 refills | Status: AC

## 2021-04-25 NOTE — Progress Notes (Signed)
HPI : Nichole Hanson is a very pleasant 65 year old female referred to [place] by Dr. Donald Prose because of a positive FIT test earlier this month.  She denies any history of seeing blood in her stools.  She denies any chronic GI symptoms such as abdominal pain, constipation or diarrhea.  She denies any chronic upper GI symptoms such as heartburn, acid regurgitation, dysphagia, dyspepsia or nausea/vomiting.  She has never had a colonoscopy.  Her father was diagnosed with colon cancer in his 60s. She denies any cardiopulmonary comorbidities, but does report occasional palpitations and 'heavy heartbeats' at night.  No light-headedness/presyncope.  No chest pain/pressure or shortness of breath.  Denies any exertional symptoms.   Past Medical History:  Diagnosis Date   Allergic rhinitis, cause unspecified 07/24/2011   Arthritis    Asthma      Past Surgical History:  Procedure Laterality Date   ABDOMINAL HYSTERECTOMY     APPENDECTOMY     CESAREAN SECTION     CHOLECYSTECTOMY     Family History  Problem Relation Age of Onset   Breast cancer Mother    Arthritis Mother    Colon cancer Father        dx in his 72's   Diabetes Sister    Diabetes Sister    Diabetes Paternal Aunt    Diabetes Paternal Aunt    Heart disease Neg Hx    Hyperlipidemia Neg Hx    Hypertension Neg Hx    Kidney disease Neg Hx    Stroke Neg Hx    Social History   Tobacco Use   Smoking status: Former    Packs/day: 0.80    Years: 25.00    Pack years: 20.00    Types: Cigarettes   Smokeless tobacco: Never  Vaping Use   Vaping Use: Every day  Substance Use Topics   Alcohol use: No   Drug use: No   Current Outpatient Medications  Medication Sig Dispense Refill   albuterol (VENTOLIN HFA) 108 (90 Base) MCG/ACT inhaler Inhale 2 puffs into the lungs every 6 (six) hours as needed for wheezing or shortness of breath. 6.7 g 0   buPROPion (WELLBUTRIN XL) 150 MG 24 hr tablet Take 150 mg by mouth daily.     docusate sodium  (COLACE) 100 MG capsule Take 300 mg by mouth at bedtime.     hydrochlorothiazide (HYDRODIURIL) 12.5 MG tablet Take 12.5 mg by mouth daily.     melatonin 3 MG TABS tablet Take 3 mg by mouth at bedtime. Gummy, 1-2 at a time     Nutritional Supplements (ESTROVEN PO) Take 1 tablet by mouth at bedtime.     OVER THE COUNTER MEDICATION ION Gut support Take 1 teaspoon daily in the AM     OVER THE COUNTER MEDICATION Fiberwise fiber drink supplement  One daily     OVER THE COUNTER MEDICATION Boost drink  One daily     OVER THE COUNTER MEDICATION cardioOmega  2 capsules once a day     OVER THE COUNTER MEDICATION Phytommega 2 capsules once a day     OVER THE COUNTER MEDICATION Replenex advance  One tablet daily     St Johns Wort 300 MG TABS Take by mouth. One twice a day     VITAMIN D PO Take by mouth. 2000iu, one daily     No current facility-administered medications for this visit.   Allergies  Allergen Reactions   Asa Arthritis Strength-Antacid [Aspirin Buffered] Other (See  Comments)    Tingling on one side body-PATIENT DENIES THIS IS AN ALLERGY     Review of Systems: All systems reviewed and negative except where noted in HPI.    DG BONE DENSITY (DXA)  Result Date: 04/18/2021 EXAM: DUAL X-RAY ABSORPTIOMETRY (DXA) FOR BONE MINERAL DENSITY IMPRESSION: Referring Physician:  Donald Prose Your patient completed a bone mineral density test using GE Lunar iDXA system (analysis version: 16). Technologist: [place] PATIENT: Name: Wilene, Pharo Patient ID: [id number] Birth Date: 1956-01-07 Height: 63.5 in. Sex: Female Measured: 04/18/2021 Weight: 132.6 lbs. Indications: Estrogen Deficient, Postmenopausal Fractures: NONE Treatments: Vitamin D (E933.5) ASSESSMENT: The BMD measured at Femur Neck Right is 0.779 g/cm2 with a T-score of -1.9. This patient is considered osteopenic/low bone mass according to [facility]) criteria. The quality of the exam is good. The lumbar spine was excluded  due to degenerative changes. Site Region Measured Date Measured Age YA BMD Significant CHANGE T-score DualFemur Neck Right 2/18/2019 67.4 -1.9 0.779 g/cm2 DualFemur Total Mean 2/18/2021 67.4 -1.1 0.864 g/cm2 Left Forearm Radius 33% 2/18/2021 67.4 0.2 0.894 g/cm2 World Health Organization [facility]) criteria for post-menopausal, Caucasian Women: Normal       T-score at or above -1 SD Osteopenia   T-score between -1 and -2.5 SD Osteoporosis T-score at or below -2.5 SD RECOMMENDATION: 1. All patients should optimize calcium and vitamin D intake. 2. Consider FDA-approved medical therapies in postmenopausal women and men aged 4 years and older, based on the following: a. A hip or vertebral (clinical or morphometric) fracture. b. T-score = -2.5 at the femoral neck or spine after appropriate evaluation to exclude secondary causes. c. Low bone mass (T-score between -1.0 and -2.5 at the femoral neck or spine) and a 10-year probability of a hip fracture = 3% or a 10-year probability of a major osteoporosis-related fracture = 20% based on the [place]-adapted WHO algorithm. d. Clinician judgment and/or patient preferences may indicate treatment for people with 10-year fracture probabilities above or below these levels. FOLLOW-UP: Patients with diagnosis of osteoporosis or at high risk for fracture should have regular bone mineral density tests.? Patients eligible for Medicare are allowed routine testing every 2 years.? The testing frequency can be increased to one year for patients who have rapidly progressing disease, are receiving or discontinuing medical therapy to restore bone mass, or have additional risk factors. I have reviewed this study and agree with the findings. [facility] FRAX* 10-year Probability of Fracture Based on femoral neck BMD: DualFemur (Right) Major Osteoporotic Fracture: 4.6% Hip Fracture:                0.7% Population:                  [place] (Black) Risk Factors:                None *FRAX is a  Materials engineer of the [employer] for Metabolic Bone Disease, a World Pharmacologist (WHO) [facility]. ASSESSMENT: The probability of a major osteoporotic fracture is 4.6% within the next ten years. The probability of a hip fracture is 0.7% within the next ten years. Electronically Signed   By: Lajean Manes M.D.   On: 04/18/2021 14:25   MM 3D SCREEN BREAST BILATERAL  Result Date: 04/18/2021 CLINICAL DATA:  Screening. EXAM: DIGITAL SCREENING BILATERAL MAMMOGRAM WITH TOMOSYNTHESIS AND CAD TECHNIQUE: Bilateral screening digital craniocaudal and mediolateral oblique mammograms were obtained. Bilateral screening digital breast tomosynthesis was performed. The  images were evaluated with computer-aided detection. COMPARISON:  Previous exam(s). ACR Breast Density Category b: There are scattered areas of fibroglandular density. FINDINGS: There are no findings suspicious for malignancy. IMPRESSION: No mammographic evidence of malignancy. A result letter of this screening mammogram will be mailed directly to the patient. RECOMMENDATION: Screening mammogram in one year. (Code:SM-B-01Y) BI-RADS CATEGORY  1: Negative. Electronically Signed   By: Lajean Manes M.D.   On: 04/18/2021 14:24    Physical Exam: BP 124/78    Pulse 77    Ht 5\' 4"  (1.626 m)    Wt 133 lb (60.3 kg)    BMI 22.83 kg/m  Constitutional: Pleasant,well-developed, African American female in no acute distress. HEENT: Normocephalic and atraumatic. Conjunctivae are normal. No scleral icterus. Cardiovascular: Normal rate, regular rhythm.  Pulmonary/chest: Effort normal and breath sounds normal. No wheezing, rales or rhonchi. Abdominal: Soft, nondistended, nontender. Bowel sounds active throughout. There are no masses palpable. No hepatomegaly. Extremities: no edema Neurological: Alert and oriented to person place and time. Skin: Skin is warm and dry. No rashes noted. Psychiatric: Normal mood and affect.  Behavior is normal.  CBC    Component Value Date/Time   WBC 11.3 (H) 12/04/2018 0220   RBC 4.07 12/04/2018 0220   HGB 10.2 (L) 12/04/2018 0220   HCT 33.1 (L) 12/04/2018 0220   PLT 603 (H) 12/04/2018 0220   MCV 81.3 12/04/2018 0220   MCH 25.1 (L) 12/04/2018 0220   MCHC 30.8 12/04/2018 0220   RDW 14.4 12/04/2018 0220   LYMPHSABS 1.8 12/04/2018 0220   MONOABS 0.7 12/04/2018 0220   EOSABS 0.0 12/04/2018 0220   BASOSABS 0.1 12/04/2018 0220    CMP     Component Value Date/Time   NA 141 12/04/2018 0220   K 4.7 12/04/2018 0220   CL 104 12/04/2018 0220   CO2 26 12/04/2018 0220   GLUCOSE 108 (H) 12/04/2018 0220   BUN 27 (H) 12/04/2018 0220   CREATININE 0.88 12/04/2018 0220   CALCIUM 8.9 12/04/2018 0220   PROT 6.1 (L) 12/04/2018 0220   ALBUMIN 2.5 (L) 12/04/2018 0220   AST 35 12/04/2018 0220   ALT 34 12/04/2018 0220   ALKPHOS 73 12/04/2018 0220   BILITOT 0.6 12/04/2018 0220   GFRNONAA >60 12/04/2018 0220   GFRAA >60 12/04/2018 0220     ASSESSMENT AND PLAN: 65 year old female with a family history of colon cancer (father, 26s), found to have positive FIT test, no chronic GI symptoms or history of overt GI bleed loss.  Will schedule patient for routine diagnostic colonoscopy.  She has no cardiopulmonary comorbidities but does report having episodes of palpitations at night.  No other concerning symptoms.  Will get echocardiogram and recommend that she follow up with her PCP to discuss further work up such an event monitor.  Positive FIT test - Colonoscopy  Palpitations - TTE - Follow up with PCP  The details, risks (including bleeding, perforation, infection, missed lesions, medication reactions and possible hospitalization or surgery if complications occur), benefits, and alternatives to colonoscopy with possible biopsy and possible polypectomy were discussed with the patient and she consents to proceed.   Alexxis Mackert E. Candis Schatz, MD [facility]   CC:  Donald Prose, MD

## 2021-04-25 NOTE — Patient Instructions (Signed)
If you are age 67 or older, your body mass index should be between 23-30. Your Body mass index is 22.83 kg/m. If this is out of the aforementioned range listed, please consider follow up with your Primary Care Provider.  If you are age 49 or younger, your body mass index should be between 19-25. Your Body mass index is 22.83 kg/m. If this is out of the aformentioned range listed, please consider follow up with your Primary Care Provider.   ________________________________________________________  The [place] [facility] providers would like to encourage you to use [facility] to communicate with providers for non-urgent requests or questions.  Due to long hold times on the telephone, sending your provider a message by [facility] may be a faster and more efficient way to get a response.  Please allow 48 business hours for a response.  Please remember that this is for non-urgent requests.  _______________________________________________________  Nichole Hanson have been scheduled for a colonoscopy. Please follow written instructions given to you at your visit today.  Please pick up your prep supplies at the pharmacy within the next 1-3 days. If you use inhalers (even only as needed), please bring them with you on the day of your procedure.  Cardiology will call you to schedule and Echocardiogram.  It was a pleasure to see you today!  Thank you for trusting me with your gastrointestinal care!    Daryel November, MD

## 2021-05-05 ENCOUNTER — Telehealth: Payer: Self-pay | Admitting: Gastroenterology

## 2021-05-05 NOTE — Telephone Encounter (Signed)
Inbound call from patient, stated that insurance will not cover prep that was given to her. Seeking advise if there is a different prep that she can get. Please advise.

## 2021-05-11 NOTE — Telephone Encounter (Signed)
Faxed Medicare Coupon to patients pharmacy.

## 2021-05-24 ENCOUNTER — Telehealth: Payer: Self-pay | Admitting: Gastroenterology

## 2021-05-24 ENCOUNTER — Other Ambulatory Visit: Payer: Self-pay

## 2021-05-24 NOTE — Telephone Encounter (Signed)
ERROR

## 2021-05-24 NOTE — Telephone Encounter (Signed)
Patient decided to do Miralax prep instead of using SUTAB coupon, Patient requested prep be mailed to [address] , [place], [address]. Patient had no questions at the end of call.

## 2021-05-24 NOTE — Telephone Encounter (Signed)
Patient called and stated that her Sutab prep was going to be too much and is seeking advice if there  is a alternative she can take. I asked about Medicare coupon and she stated that pharmacy did not say anything about it. Please advise.

## 2021-06-06 DIAGNOSIS — J309 Allergic rhinitis, unspecified: Secondary | ICD-10-CM | POA: Diagnosis not present

## 2021-06-06 DIAGNOSIS — H9202 Otalgia, left ear: Secondary | ICD-10-CM | POA: Diagnosis not present

## 2021-06-15 ENCOUNTER — Telehealth: Payer: Self-pay | Admitting: Gastroenterology

## 2021-06-15 NOTE — Telephone Encounter (Signed)
Authorization #[id number]  Tracking #[id number] Request details   Primary diagnosis R00.2 - Palpitations  Secondary diagnosis --  Care type Outpatient  Place of service Office  Number of service dates 1  Dates of service 06/15/2021 - 09/13/2021  Ordering provider Patrice Paradise MD / NPI - [id number]  Performing provider Patrice Paradise MD / NPI - [id number]  Performing facility or agency [facility]  Expedited No  Requested by Morton Amy - Portal  PAL category Code Description Diagnostic/cardiac imaging [phone] Echocardiography, transthoracic, real-time with image documentation (2D), includes M-mode recording, when performed, complete, with spectral Doppler echocardiography, and with color flow Doppler echocardiography [phone] Transthoracic echocardiography with contrast, or without contrast followed by with contrast, real-time with image documentation (2d), includes m-mode recording, when performed, complete, with spectral doppler echocardiography, and with color flow doppler echocardiography

## 2021-06-16 ENCOUNTER — Encounter: Payer: Self-pay | Admitting: Gastroenterology

## 2021-06-17 ENCOUNTER — Telehealth: Payer: Self-pay | Admitting: Gastroenterology

## 2021-06-17 NOTE — Telephone Encounter (Signed)
Patient returned call advised her of recommendation below of getting original physician who prescribed inhaler for refill. Patient expressed understanding and will call them

## 2021-06-17 NOTE — Telephone Encounter (Signed)
Noted  

## 2021-06-17 NOTE — Telephone Encounter (Signed)
Inbound call from patient, stated that Dr. Candis Schatz wanted her to bring her Inhaler to her procedure on 2/14. Stated that it was out of date and wanted to see if there was anyway she could get it refilled. Please advise.

## 2021-06-17 NOTE — Telephone Encounter (Signed)
Left message for pt that she would need to get the refill for the inhaler from the original prescribing physician.

## 2021-06-21 ENCOUNTER — Encounter: Payer: Self-pay | Admitting: Gastroenterology

## 2021-06-21 ENCOUNTER — Ambulatory Visit (AMBULATORY_SURGERY_CENTER): Payer: Medicare HMO | Admitting: Gastroenterology

## 2021-06-21 VITALS — BP 128/72 | HR 59 | Temp 98.6°F | Resp 14 | Ht 64.0 in | Wt 133.0 lb

## 2021-06-21 DIAGNOSIS — R195 Other fecal abnormalities: Secondary | ICD-10-CM

## 2021-06-21 DIAGNOSIS — D122 Benign neoplasm of ascending colon: Secondary | ICD-10-CM

## 2021-06-21 DIAGNOSIS — Z1211 Encounter for screening for malignant neoplasm of colon: Secondary | ICD-10-CM | POA: Diagnosis not present

## 2021-06-21 DIAGNOSIS — D125 Benign neoplasm of sigmoid colon: Secondary | ICD-10-CM | POA: Diagnosis not present

## 2021-06-21 MED ORDER — SODIUM CHLORIDE 0.9 % IV SOLN: 500.0000 mL | Freq: Once | INTRAVENOUS | Status: DC

## 2021-06-21 NOTE — Op Note (Signed)
[place] Patient Name: Nichole Hanson Procedure Date: 06/21/2021 9:46 AM MRN: [id number] Endoscopist: Nicki Reaper E. Candis Schatz , MD Age: 66 Referring MD:  Date of Birth: 1955/06/15 Gender: Female Account #: [id number] Procedure:                Colonoscopy Indications:              Positive fecal immunochemical test Medicines:                Monitored Anesthesia Care Procedure:                Pre-Anesthesia Assessment:                           - Prior to the procedure, a History and Physical                            was performed, and patient medications and                            allergies were reviewed. The patient's tolerance of                            previous anesthesia was also reviewed. The risks                            and benefits of the procedure and the sedation                            options and risks were discussed with the patient.                            All questions were answered, and informed consent                            was obtained. Prior Anticoagulants: The patient has                            taken no previous anticoagulant or antiplatelet                            agents. ASA Grade Assessment: II - A patient with                            mild systemic disease. After reviewing the risks                            and benefits, the patient was deemed in                            satisfactory condition to undergo the procedure.                           After obtaining informed consent, the colonoscope  was passed under direct vision. Throughout the                            procedure, the patient's blood pressure, pulse, and                            oxygen saturations were monitored continuously. The                            Olympus PCF-H190DL ([id number]) Colonoscope was                            introduced through the anus and advanced to the the                            cecum, identified by  appendiceal orifice and                            ileocecal valve. The colonoscopy was somewhat                            difficult due to a tortuous colon. Successful                            completion of the procedure was aided by using                            manual pressure. The patient tolerated the                            procedure well. The quality of the bowel                            preparation was adequate. The ileocecal valve,                            appendiceal orifice, and rectum were photographed.                            The bowel preparation used was Miralax via split                            dose instruction. Scope In: 10:05:24 AM Scope Out: 10:25:12 AM Scope Withdrawal Time: 0 hours 14 minutes 38 seconds  Total Procedure Duration: 0 hours 19 minutes 48 seconds  Findings:                 The perianal and digital rectal examinations were                            normal. Pertinent negatives include normal                            sphincter tone and no palpable rectal lesions.  Two small angioectasias without bleeding were found                            in the cecum.                           A 14 mm polyp was found in the proximal ascending                            colon. The polyp was sessile. The polyp was removed                            with a piecemeal technique using a cold snare.                            Resection and retrieval were complete. Estimated                            blood loss was minimal.                           A 4 mm polyp was found in the sigmoid colon. The                            polyp was sessile. The polyp was removed with a                            cold snare. Resection and retrieval were complete.                            Estimated blood loss was minimal.                           The exam was otherwise normal throughout the                            examined colon.                            The retroflexed view of the distal rectum and anal                            verge was normal and showed no anal or rectal                            abnormalities. Complications:            No immediate complications. Estimated Blood Loss:     Estimated blood loss was minimal. Impression:               - Two non-bleeding colonic angioectasias.                           - One 14 mm polyp in the proximal ascending colon,  removed piecemeal using a cold snare. Resected and                            retrieved.                           - One 4 mm polyp in the sigmoid colon, removed with                            a cold snare. Resected and retrieved.                           - The distal rectum and anal verge are normal on                            retroflexion view. Recommendation:           - Patient has a contact number available for                            emergencies. The signs and symptoms of potential                            delayed complications were discussed with the                            patient. Return to normal activities tomorrow.                            Written discharge instructions were provided to the                            patient.                           - Resume previous diet.                           - Continue present medications.                           - Await pathology results.                           - Repeat colonoscopy (date not yet determined) for                            surveillance based on pathology results. Onyx Edgley E. Candis Schatz, MD 06/21/2021 10:31:55 AM This report has been signed electronically.

## 2021-06-21 NOTE — Patient Instructions (Signed)
Handout given for polyps.  YOU HAD AN ENDOSCOPIC PROCEDURE TODAY AT THE [facility]:   Refer to the procedure report that was given to you for any specific questions about what was found during the examination.  If the procedure report does not answer your questions, please call your gastroenterologist to clarify.  If you requested that your care partner not be given the details of your procedure findings, then the procedure report has been included in a sealed envelope for you to review at your convenience later.  YOU SHOULD EXPECT: Some feelings of bloating in the abdomen. Passage of more gas than usual.  Walking can help get rid of the air that was put into your GI tract during the procedure and reduce the bloating. If you had a lower endoscopy (such as a colonoscopy or flexible sigmoidoscopy) you may notice spotting of blood in your stool or on the toilet paper. If you underwent a bowel prep for your procedure, you may not have a normal bowel movement for a few days.  Please Note:  You might notice some irritation and congestion in your nose or some drainage.  This is from the oxygen used during your procedure.  There is no need for concern and it should clear up in a day or so.  SYMPTOMS TO REPORT IMMEDIATELY:   Following lower endoscopy (colonoscopy or flexible sigmoidoscopy):  Excessive amounts of blood in the stool  Significant tenderness or worsening of abdominal pains  Swelling of the abdomen that is new, acute  Fever of 100F or higher  For urgent or emergent issues, a gastroenterologist can be reached at any hour by calling [phone]. Do not use MyChart messaging for urgent concerns.    DIET:  We do recommend a small meal at first, but then you may proceed to your regular diet.  Drink plenty of fluids but you should avoid alcoholic beverages for 24 hours.  ACTIVITY:  You should plan to take it easy for the rest of today and you should NOT DRIVE or use heavy  machinery until tomorrow (because of the sedation medicines used during the test).    FOLLOW UP: Our staff will call the number listed on your records 48-72 hours following your procedure to check on you and address any questions or concerns that you may have regarding the information given to you following your procedure. If we do not reach you, we will leave a message.  We will attempt to reach you two times.  During this call, we will ask if you have developed any symptoms of COVID 19. If you develop any symptoms (ie: fever, flu-like symptoms, shortness of breath, cough etc.) before then, please call [phone].  If you test positive for Covid 19 in the 2 weeks post procedure, please call and report this information to us.    If any biopsies were taken you will be contacted by phone or by letter within the next 1-3 weeks.  Please call us at [phone] if you have not heard about the biopsies in 3 weeks.    SIGNATURES/CONFIDENTIALITY: You and/or your care partner have signed paperwork which will be entered into your electronic medical record.  These signatures attest to the fact that that the information above on your After Visit Summary has been reviewed and is understood.  Full responsibility of the confidentiality of this discharge information lies with you and/or your care-partner. 

## 2021-06-21 NOTE — Progress Notes (Signed)
Pt's states no medical or surgical changes since previsit or office visit.  VS CW  

## 2021-06-21 NOTE — Progress Notes (Signed)
Called to room to assist during endoscopic procedure.  Patient ID and intended procedure confirmed with present staff. Received instructions for my participation in the procedure from the performing physician.  

## 2021-06-21 NOTE — Progress Notes (Signed)
[facility] History and Physical   Primary Care Physician:  Donald Prose, MD   Reason for Procedure:   Positive fecal occult blood test  Plan:    Colonoscopy     HPI: Nichole Hanson is a 66 y.o. female undergoing colonoscopy after a positive FIT test. Her father was diagnosed with colon cancer in his 50s.  She states she has never had a colonoscopy.  She has no chronic GI symptoms.   Past Medical History:  Diagnosis Date   Allergic rhinitis, cause unspecified 07/24/2011   Arthritis    Asthma     Past Surgical History:  Procedure Laterality Date   ABDOMINAL HYSTERECTOMY     APPENDECTOMY     CESAREAN SECTION     CHOLECYSTECTOMY      Prior to Admission medications   Medication Sig Start Date End Date Taking? Authorizing Provider  albuterol (VENTOLIN HFA) 108 (90 Base) MCG/ACT inhaler Inhale 2 puffs into the lungs every 6 (six) hours as needed for wheezing or shortness of breath. 12/04/18  Yes Thurnell Lose, MD  buPROPion (WELLBUTRIN XL) 150 MG 24 hr tablet Take 150 mg by mouth daily.   Yes [provider]  docusate sodium (COLACE) 100 MG capsule Take 300 mg by mouth at bedtime.   Yes [provider]  hydrochlorothiazide (HYDRODIURIL) 12.5 MG tablet Take 12.5 mg by mouth daily.   Yes [provider]  melatonin 3 MG TABS tablet Take 3 mg by mouth at bedtime. Gummy, 1-2 at a time   Yes [provider]  Nutritional Supplements (ESTROVEN PO) Take 1 tablet by mouth at bedtime.   Yes [provider]  OVER THE COUNTER MEDICATION ION Gut support Take 1 teaspoon daily in the AM   Yes [provider]  [place] fiber drink supplement  One daily   Yes [provider]  [place]  2 capsules once a day   Yes [provider]  OVER THE COUNTER MEDICATION Phytommega 2 capsules once a day   Yes [provider]  St Johns Wort 300 MG TABS  Take by mouth. One twice a day   Yes [provider]  VITAMIN D PO Take by mouth. 2000iu, one daily   Yes [provider]  OVER THE COUNTER MEDICATION Boost drink  One daily    [provider]  OVER THE COUNTER MEDICATION Replenex advance  One tablet daily    [provider]    Current Outpatient Medications  Medication Sig Dispense Refill   albuterol (VENTOLIN HFA) 108 (90 Base) MCG/ACT inhaler Inhale 2 puffs into the lungs every 6 (six) hours as needed for wheezing or shortness of breath. 6.7 g 0   buPROPion (WELLBUTRIN XL) 150 MG 24 hr tablet Take 150 mg by mouth daily.     docusate sodium (COLACE) 100 MG capsule Take 300 mg by mouth at bedtime.     hydrochlorothiazide (HYDRODIURIL) 12.5 MG tablet Take 12.5 mg by mouth daily.     melatonin 3 MG TABS tablet Take 3 mg by mouth at bedtime. Gummy, 1-2 at a time     Nutritional Supplements (ESTROVEN PO) Take 1 tablet by mouth at bedtime.     OVER THE COUNTER MEDICATION ION Gut support Take 1 teaspoon daily in the AM     OVER THE COUNTER MEDICATION Fiberwise fiber drink supplement  One daily     OVER THE COUNTER MEDICATION cardioOmega  2  capsules once a day     OVER THE COUNTER MEDICATION Phytommega 2 capsules once a day     St Johns Wort 300 MG TABS Take by mouth. One twice a day     VITAMIN D PO Take by mouth. 2000iu, one daily     OVER THE COUNTER MEDICATION Boost drink  One daily     OVER THE COUNTER MEDICATION Replenex advance  One tablet daily     Current Facility-Administered Medications  Medication Dose Route Frequency Provider Last Rate Last Admin   0.9 %  sodium chloride infusion  500 mL Intravenous Once Daryel November, MD        Allergies as of 06/21/2021 - Review Complete 06/21/2021  Allergen Reaction Noted   Asa arthritis strength-antacid [aspirin buffered] Other (See Comments) 08/15/2011    Family History  Problem Relation Age of Onset   Breast cancer Mother     Arthritis Mother    Colon cancer Father        dx in his 1's   Diabetes Sister    Diabetes Sister    Diabetes Paternal Aunt    Diabetes Paternal Aunt    Heart disease Neg Hx    Hyperlipidemia Neg Hx    Hypertension Neg Hx    Kidney disease Neg Hx    Stroke Neg Hx     Social History   Socioeconomic History   Marital status: Married    Spouse name: Not on file   Number of children: 3   Years of education: Not on file   Highest education level: Not on file  Occupational History   Occupation: [employer]  Tobacco Use   Smoking status: Former    Packs/day: 0.80    Years: 25.00    Pack years: 20.00    Types: Cigarettes   Smokeless tobacco: Never  Vaping Use   Vaping Use: Every day  Substance and Sexual Activity   Alcohol use: No   Drug use: No   Sexual activity: Yes    Birth control/protection: Surgical  Other Topics Concern   Not on file  Social History Narrative   Not on file   Social Determinants of Health   Financial Resource Strain: Not on file  Food Insecurity: Not on file  Transportation Needs: Not on file  Physical Activity: Not on file  Stress: Not on file  Social Connections: Not on file  Intimate Partner Violence: Not on file    Review of Systems:  All other review of systems negative except as mentioned in the HPI.  Physical Exam: Vital signs BP (!) 156/96    Pulse 89    Temp 98.6 F (37 C) (Temporal)    Ht 5\' 4"  (1.626 m)    Wt 133 lb (60.3 kg)    SpO2 99%    BMI 22.83 kg/m   General:   Alert,  Well-developed, well-nourished, pleasant and cooperative in NAD Airway:  Mallampati 2 Lungs:  Clear throughout to auscultation.   Heart:  Regular rate and rhythm; no murmurs, clicks, rubs,  or gallops. Abdomen:  Soft, nontender and nondistended. Normal bowel sounds.   Neuro/Psych:  Normal mood and affect. A and O x 3   Kaden Dunkel E. Candis Schatz, MD [facility]

## 2021-06-21 NOTE — Progress Notes (Signed)
PT taken to PACU. Monitors in place. VSS. Report given to RN. 

## 2021-06-23 ENCOUNTER — Other Ambulatory Visit (HOSPITAL_COMMUNITY): Payer: Medicare HMO

## 2021-06-23 ENCOUNTER — Telehealth: Payer: Self-pay

## 2021-06-23 ENCOUNTER — Telehealth (HOSPITAL_COMMUNITY): Payer: Self-pay | Admitting: Gastroenterology

## 2021-06-23 ENCOUNTER — Encounter: Payer: Self-pay | Admitting: Gastroenterology

## 2021-06-23 NOTE — Telephone Encounter (Signed)
Patient called and cancelled Echocardiogram and does not want to reschedule at this time. Order will be removed from the Active ECHO WQ and if patient calls back to reschedule we will reinstate the order.

## 2021-06-23 NOTE — Telephone Encounter (Signed)
Called [phone] and left a message we tried to reach pt for a follow up call. maw

## 2021-07-03 ENCOUNTER — Telehealth: Payer: Medicare HMO | Admitting: Emergency Medicine

## 2021-07-03 DIAGNOSIS — B029 Zoster without complications: Secondary | ICD-10-CM | POA: Diagnosis not present

## 2021-07-03 DIAGNOSIS — R21 Rash and other nonspecific skin eruption: Secondary | ICD-10-CM

## 2021-07-03 MED ORDER — VALACYCLOVIR HCL 1 G PO TABS: 1000.0000 mg | ORAL_TABLET | Freq: Two times a day (BID) | ORAL | 0 refills | Status: AC

## 2021-07-03 MED ORDER — PREDNISONE 10 MG (21) PO TBPK: ORAL_TABLET | Freq: Every day | ORAL | 0 refills | Status: DC

## 2021-07-03 NOTE — Patient Instructions (Signed)
Takeria F Feazell, thank you for joining [place], PA-C for today's virtual visit.  While this provider is not your primary care provider (PCP), if your PCP is located in our provider database this encounter information will be shared with them immediately following your visit.  Consent: (Patient) Nichole [facility]on provided verbal consent for this virtual visit at the beginning of the encounter.  Current Medications:  Current Outpatient Medications:    predniSONE (STERAPRED UNI-PAK 21 TAB) 10 MG (21) TBPK tablet, Take by mouth daily. Take 6 tabs by mouth daily  for 2 days, then 5 tabs for 2 days, then 4 tabs for 2 days, then 3 tabs for 2 days, 2 tabs for 2 days, then 1 tab by mouth daily for 2 days, Disp: 42 tablet, Rfl: 0   valACYclovir (VALTREX) 1000 MG tablet, Take 1 tablet (1,000 mg total) by mouth 2 (two) times daily for 10 days., Disp: 20 tablet, Rfl: 0   albuterol (VENTOLIN HFA) 108 (90 Base) MCG/ACT inhaler, Inhale 2 puffs into the lungs every 6 (six) hours as needed for wheezing or shortness of breath., Disp: 6.7 g, Rfl: 0   buPROPion (WELLBUTRIN XL) 150 MG 24 hr tablet, Take 150 mg by mouth daily., Disp: , Rfl:    cetirizine (ZYRTEC) 10 MG tablet, Take 10 mg by mouth daily., Disp: , Rfl:    docusate sodium (COLACE) 100 MG capsule, Take 300 mg by mouth at bedtime., Disp: , Rfl:    hydrochlorothiazide (HYDRODIURIL) 12.5 MG tablet, Take 12.5 mg by mouth daily., Disp: , Rfl:    melatonin 3 MG TABS tablet, Take 3 mg by mouth at bedtime. Gummy, 1-2 at a time, Disp: , Rfl:    Nutritional Supplements (ESTROVEN PO), Take 1 tablet by mouth at bedtime., Disp: , Rfl:    OVER THE COUNTER MEDICATION, ION Gut support Take 1 teaspoon daily in the AM, Disp: , Rfl:    OVER THE COUNTER MEDICATION, Fiberwise fiber drink supplement  One daily, Disp: , Rfl:    OVER THE COUNTER MEDICATION, Boost drink  One daily, Disp: , Rfl:    OVER THE COUNTER MEDICATION, cardioOmega  2 capsules once a day, Disp: , Rfl:     OVER THE COUNTER MEDICATION, Phytommega 2 capsules once a day, Disp: , Rfl:    OVER THE COUNTER MEDICATION, Replenex advance  One tablet daily, Disp: , Rfl:    St Johns Wort 300 MG TABS, Take by mouth. One twice a day, Disp: , Rfl:    VITAMIN D PO, Take by mouth. 2000iu, one daily, Disp: , Rfl:    Medications ordered in this encounter:  Meds ordered this encounter  Medications   valACYclovir (VALTREX) 1000 MG tablet    Sig: Take 1 tablet (1,000 mg total) by mouth 2 (two) times daily for 10 days.    Dispense:  20 tablet    Refill:  0    Order Specific Question:   Supervising Provider    Answer:   MILLER, BRIAN [[id number]]   predniSONE (STERAPRED UNI-PAK 21 TAB) 10 MG (21) TBPK tablet    Sig: Take by mouth daily. Take 6 tabs by mouth daily  for 2 days, then 5 tabs for 2 days, then 4 tabs for 2 days, then 3 tabs for 2 days, 2 tabs for 2 days, then 1 tab by mouth daily for 2 days    Dispense:  42 tablet    Refill:  0    Order Specific Question:  Supervising Provider    Answer:   Noemi Chapel [phone]     *If you need refills on other medications prior to your next appointment, please contact your pharmacy*  Follow-Up: Call back or seek an in-person evaluation if the symptoms worsen or if the condition fails to improve as anticipated.  Other Instructions Rest and use ice/heat as needed for symptomatic relief Prescribed valacyclovir 1000mg  2x/day for 10 days. Prescribed prednisone taper for inflammation and pain Use OTC medications such as ibuprofen/ tylenol.   Follow up with PCP in 7-10 days if rash is still present Follow up with PCP if symptoms of burning, stinging, tingling or numbness occur after rash resolves, you may need additional treatment Follow up in person at urgent care or go to ER if you have any new or worsening symptoms (such as eye involvement, severe pain, or signs of secondary infection such as fever, chills, nausea, vomiting, discharge, redness or warmth over site of rash)     If you have been instructed to have an in-person evaluation today at a local Urgent Care facility, please use the link below. It will take you to a list of all of our available [facility], including address, phone number and hours of operation. Please do not delay care.  [facility]  If you or a family member do not have a primary care provider, use the link below to schedule a visit and establish care. When you choose a [facility] primary care physician or advanced practice provider, you gain a long-term partner in health. Find a Primary Care Provider  Learn more about [facility] in-office and virtual care options: [facility]

## 2021-07-03 NOTE — Progress Notes (Signed)
Virtual Visit Consent   Nichole [facility]on, you are scheduled for a virtual visit with a  provider today.     Just as with appointments in the office, your consent must be obtained to participate.  Your consent will be active for this visit and any virtual visit you may have with one of our providers in the next 365 days.     If you have a MyChart account, a copy of this consent can be sent to you electronically.  All virtual visits are billed to your insurance company just like a traditional visit in the office.    As this is a virtual visit, video technology does not allow for your provider to perform a traditional examination.  This may limit your provider's ability to fully assess your condition.  If your provider identifies any concerns that need to be evaluated in person or the need to arrange testing (such as labs, EKG, etc.), we will make arrangements to do so.     Although advances in technology are sophisticated, we cannot ensure that it will always work on either your end or our end.  If the connection with a video visit is poor, the visit may have to be switched to a telephone visit.  With either a video or telephone visit, we are not always able to ensure that we have a secure connection.     I need to obtain your verbal consent now.   Are you willing to proceed with your visit today? Yes   [name] has provided verbal consent on 07/03/2021 for a virtual visit (video or telephone).   [name], [place]   Date: 07/03/2021 6:25 PM   Virtual Visit via Video Note   I, [name], connected with  Starkeisha Vanwinkle Danese  ([id number], 21-Dec-1955) on 07/03/21 at  6:00 PM EST by a video-enabled telemedicine application and verified that I am speaking with the correct person using two identifiers.  Location: Patient: Virtual Visit Location Patient: Home Provider: Virtual Visit Location Provider: Home Office   I discussed the limitations of evaluation and management by  telemedicine and the availability of in person appointments. The patient expressed understanding and agreed to proceed.    History of Present Illness:Nichole Hanson is a 66 y.o. who identifies as a female who was assigned female at birth, and is being seen today for rash x 4 days ago.  Denies precipitating event or trauma.  Denies changes in soaps, detergents, close contacts with similar rash, known trigger or environmental trigger, allergy. Denies medications change or starting a new medication recently.  Localizes the rash to RT thigh.  Describes it as bumpy, red, and like blisters.  Has tried antibacterial ointment with relief.  Denies aggravating factors.  Reports similar symptoms in the past that resolved without intervention.   Denies fever, chills, nausea, vomiting, swelling, discharge, SOB, chest pain.  ROS: As per HPI.  All other pertinent ROS negative.     HPI: HPI  Problems:  Patient Active Problem List   Diagnosis Date Noted   Acute respiratory disease due to COVID-19 virus 11/30/2018   CKD (chronic kidney disease), stage II 11/30/2018   HTN (hypertension) 11/30/2018   Depression 11/30/2018   Acute respiratory failure with hypoxia ([place]) 11/30/2018   Allergic rhinitis, cause unspecified 07/24/2011   Hematuria 07/07/2011   Tobacco abuse 07/06/2011   Routine general medical examination at a health care facility 07/06/2011   Family history of colon cancer 07/06/2011  Other screening mammogram 07/06/2011    Allergies:  Allergies  Allergen Reactions   Asa Arthritis Strength-Antacid [Aspirin Buffered] Other (See Comments)    Tingling on one side body-PATIENT DENIES THIS IS AN ALLERGY   Medications:  Current Outpatient Medications:    predniSONE (STERAPRED UNI-PAK 21 TAB) 10 MG (21) TBPK tablet, Take by mouth daily. Take 6 tabs by mouth daily  for 2 days, then 5 tabs for 2 days, then 4 tabs for 2 days, then 3 tabs for 2 days, 2 tabs for 2 days, then 1 tab by mouth daily for 2 days,  Disp: 42 tablet, Rfl: 0   valACYclovir (VALTREX) 1000 MG tablet, Take 1 tablet (1,000 mg total) by mouth 2 (two) times daily for 10 days., Disp: 20 tablet, Rfl: 0   albuterol (VENTOLIN HFA) 108 (90 Base) MCG/ACT inhaler, Inhale 2 puffs into the lungs every 6 (six) hours as needed for wheezing or shortness of breath., Disp: 6.7 g, Rfl: 0   buPROPion (WELLBUTRIN XL) 150 MG 24 hr tablet, Take 150 mg by mouth daily., Disp: , Rfl:    cetirizine (ZYRTEC) 10 MG tablet, Take 10 mg by mouth daily., Disp: , Rfl:    docusate sodium (COLACE) 100 MG capsule, Take 300 mg by mouth at bedtime., Disp: , Rfl:    hydrochlorothiazide (HYDRODIURIL) 12.5 MG tablet, Take 12.5 mg by mouth daily., Disp: , Rfl:    melatonin 3 MG TABS tablet, Take 3 mg by mouth at bedtime. Gummy, 1-2 at a time, Disp: , Rfl:    Nutritional Supplements (ESTROVEN PO), Take 1 tablet by mouth at bedtime., Disp: , Rfl:    OVER THE COUNTER MEDICATION, ION Gut support Take 1 teaspoon daily in the AM, Disp: , Rfl:    OVER THE COUNTER MEDICATION, Fiberwise fiber drink supplement  One daily, Disp: , Rfl:    OVER THE COUNTER MEDICATION, Boost drink  One daily, Disp: , Rfl:    OVER THE COUNTER MEDICATION, cardioOmega  2 capsules once a day, Disp: , Rfl:    OVER THE COUNTER MEDICATION, Phytommega 2 capsules once a day, Disp: , Rfl:    OVER THE COUNTER MEDICATION, Replenex advance  One tablet daily, Disp: , Rfl:    St Johns Wort 300 MG TABS, Take by mouth. One twice a day, Disp: , Rfl:    VITAMIN D PO, Take by mouth. 2000iu, one daily, Disp: , Rfl:   Observations/Objective: Patient is well-developed, well-nourished in no acute distress.  Resting comfortably at home.  Head is normocephalic, atraumatic.  No labored breathing. Speech is clear and coherent with logical content.  Patient is alert and oriented at baseline.  Skin: mildly erythematous vesicular rash to RT proximal lateral thigh, no obvious bleeding or drainage  Assessment and Plan: 1.  Herpes zoster without complication  2. Rash and nonspecific skin eruption  Valtrex dose adjusted for kidney impairment although creatinine clearance was 59 mL/min based on blood work done a few years ago.    Rest and use ice/heat as needed for symptomatic relief Prescribed valacyclovir 1000mg  2x/day for 10 days. Prescribed prednisone taper for inflammation and pain Use OTC medications such as ibuprofen/ tylenol.   Follow up with PCP in 7-10 days if rash is still present Follow up with PCP if symptoms of burning, stinging, tingling or numbness occur after rash resolves, you may need additional treatment Follow up in person at urgent care or go to ER if you have any new or worsening symptoms (such as  eye involvement, severe pain, or signs of secondary infection such as fever, chills, nausea, vomiting, discharge, redness or warmth over site of rash)   Follow Up Instructions: I discussed the assessment and treatment plan with the patient. The patient was provided an opportunity to ask questions and all were answered. The patient agreed with the plan and demonstrated an understanding of the instructions.  A copy of instructions were sent to the patient via MyChart unless otherwise noted below.   The patient was advised to call back or seek an in-person evaluation if the symptoms worsen or if the condition fails to improve as anticipated.  Time:  I spent 10-15 minutes with the patient via telehealth technology discussing the above problems/concerns.    [name], PA-C

## 2021-07-06 DIAGNOSIS — B029 Zoster without complications: Secondary | ICD-10-CM | POA: Diagnosis not present

## 2021-07-14 DIAGNOSIS — Z0100 Encounter for examination of eyes and vision without abnormal findings: Secondary | ICD-10-CM | POA: Diagnosis not present

## 2021-09-21 DIAGNOSIS — F3341 Major depressive disorder, recurrent, in partial remission: Secondary | ICD-10-CM | POA: Diagnosis not present

## 2021-09-21 DIAGNOSIS — J449 Chronic obstructive pulmonary disease, unspecified: Secondary | ICD-10-CM | POA: Diagnosis not present

## 2021-09-21 DIAGNOSIS — I10 Essential (primary) hypertension: Secondary | ICD-10-CM | POA: Diagnosis not present

## 2021-09-21 DIAGNOSIS — E7800 Pure hypercholesterolemia, unspecified: Secondary | ICD-10-CM | POA: Diagnosis not present

## 2021-09-21 DIAGNOSIS — J309 Allergic rhinitis, unspecified: Secondary | ICD-10-CM | POA: Diagnosis not present

## 2022-03-07 IMAGING — MG MM DIGITAL SCREENING BILAT W/ TOMO AND CAD
8 series · 8 of 24 positions shown · non-contrast
Comparison: Previous exam(s).

CLINICAL DATA: Screening.

EXAM:
DIGITAL SCREENING BILATERAL MAMMOGRAM WITH TOMOSYNTHESIS AND CAD
TECHNIQUE: Bilateral screening digital craniocaudal and mediolateral oblique
mammograms were obtained. Bilateral screening digital breast
tomosynthesis was performed. The images were evaluated with
computer-aided detection.

[R MLO synth-2D]
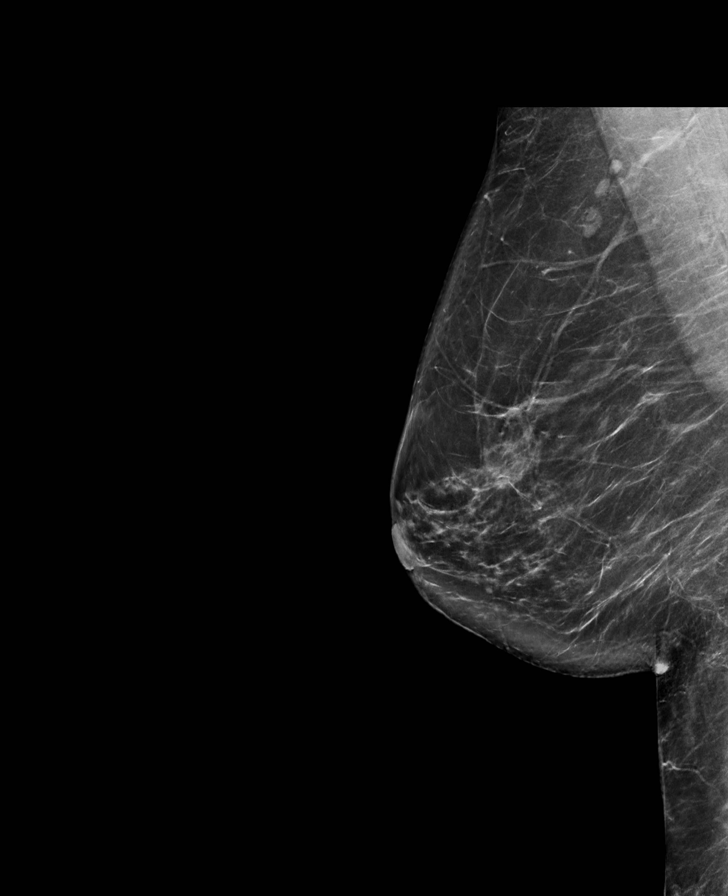

[L CC synth-2D]
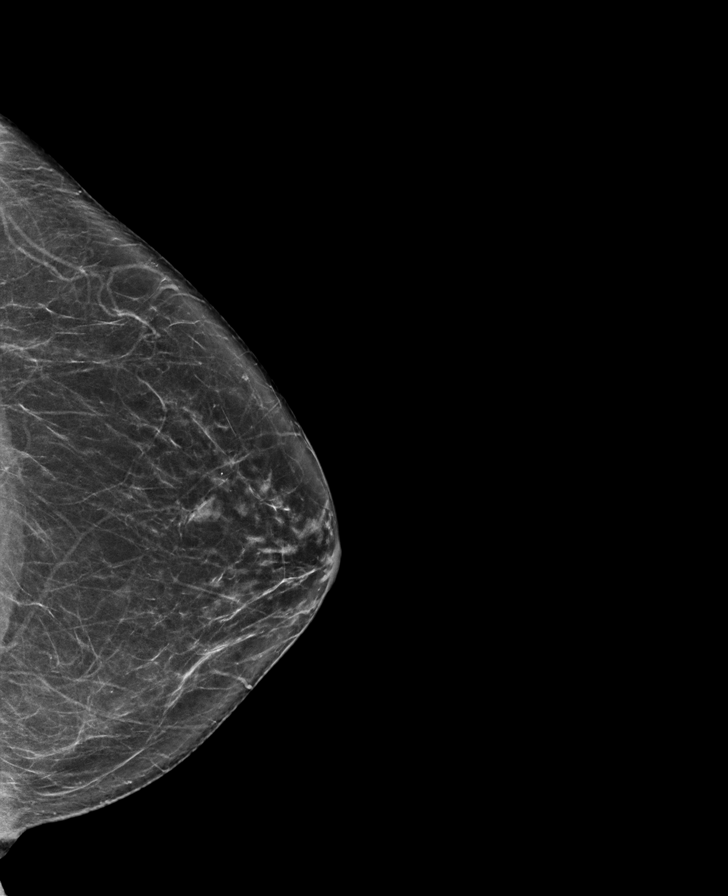

[L MLO synth-2D]
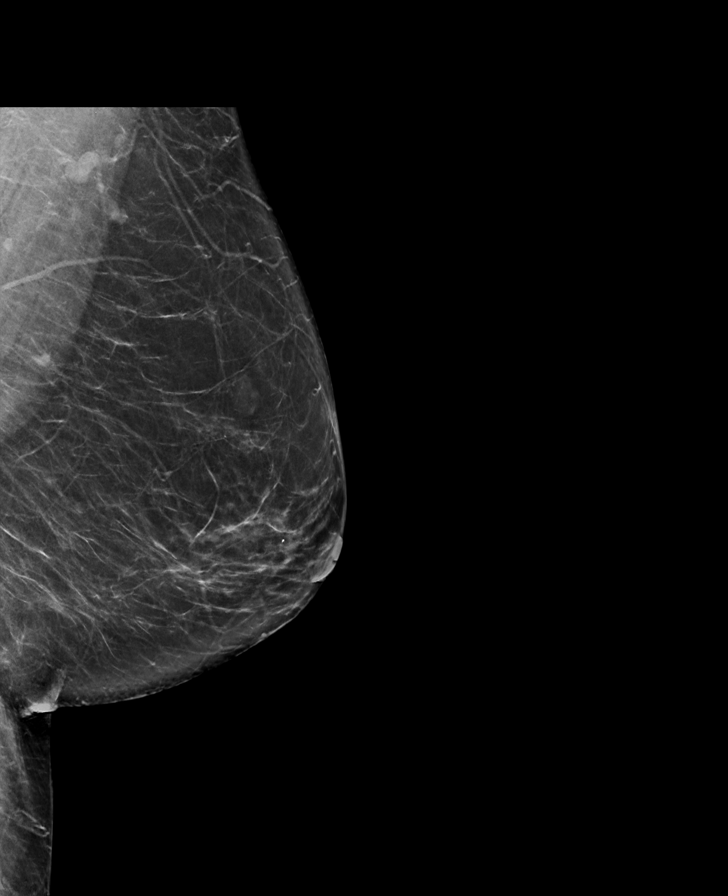

[R CC synth-2D]
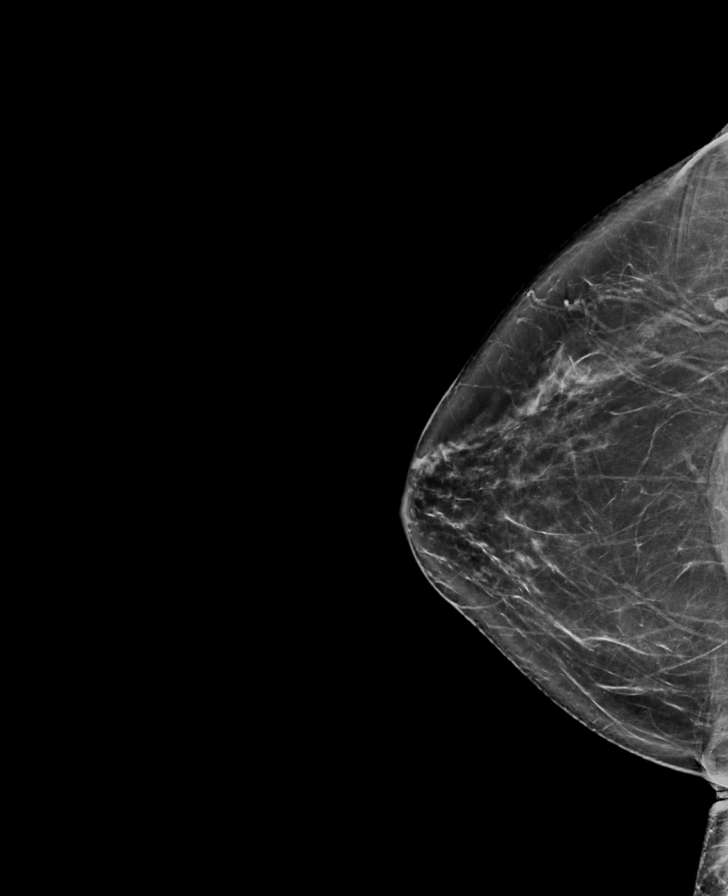

[R MLO tomo · tomo slice 41/82.0]
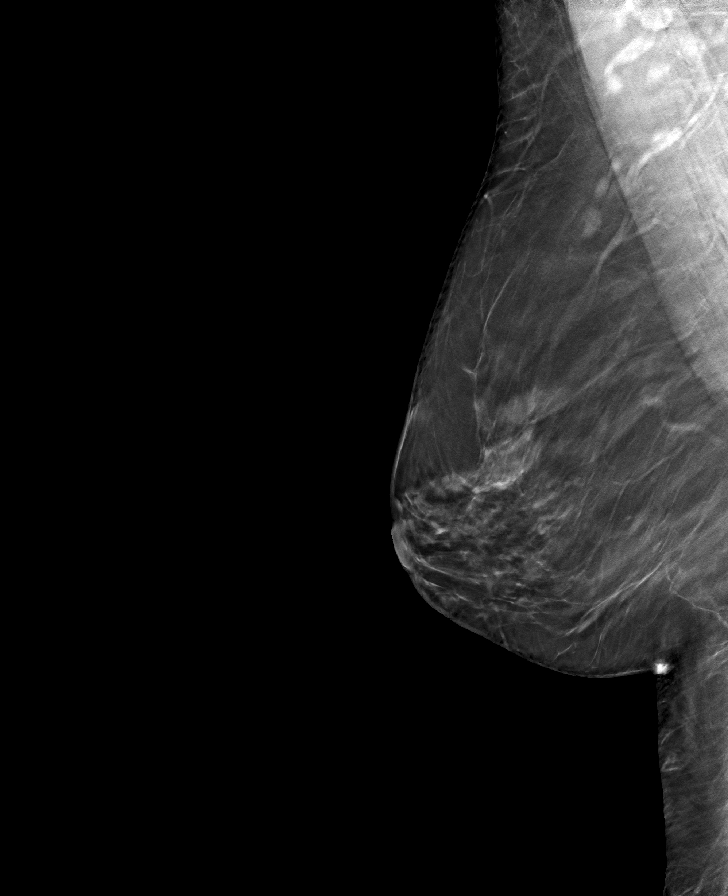

[R CC tomo · tomo slice 37/74.0]
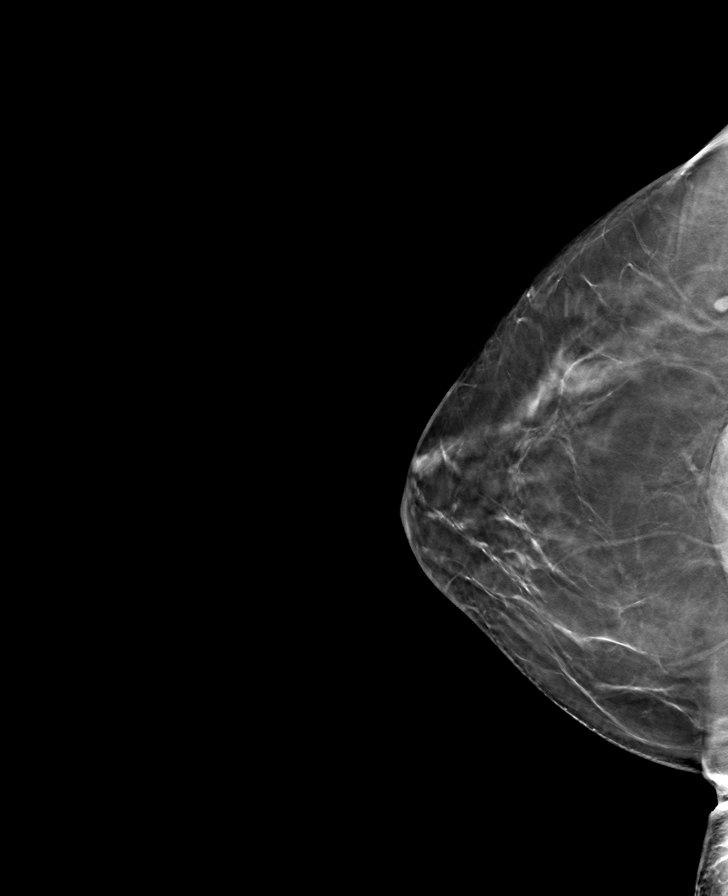

[L CC tomo · tomo slice 35/70.0]
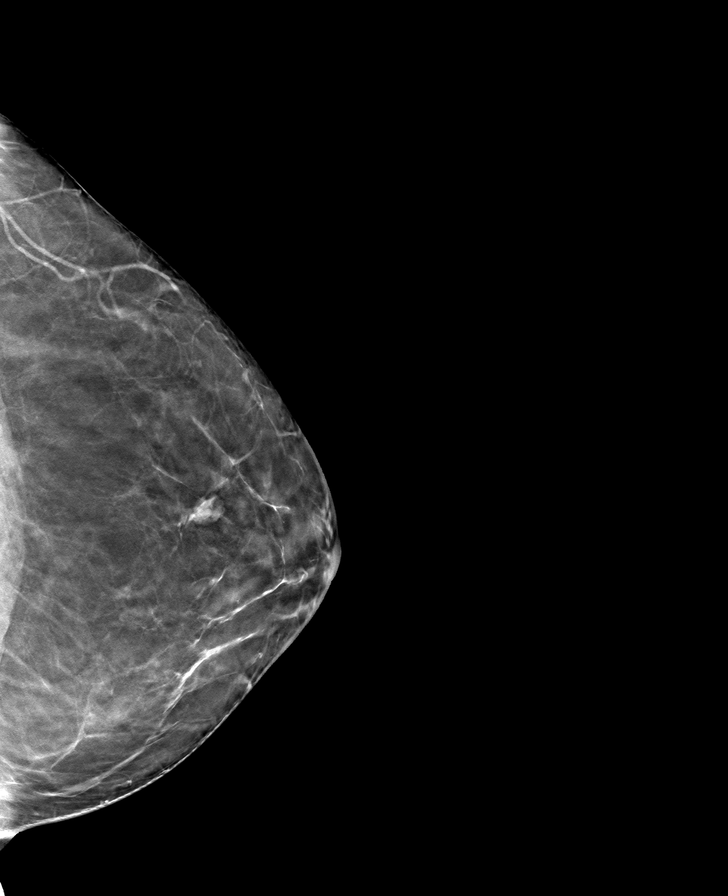

[L MLO tomo · tomo slice 39/77.0]
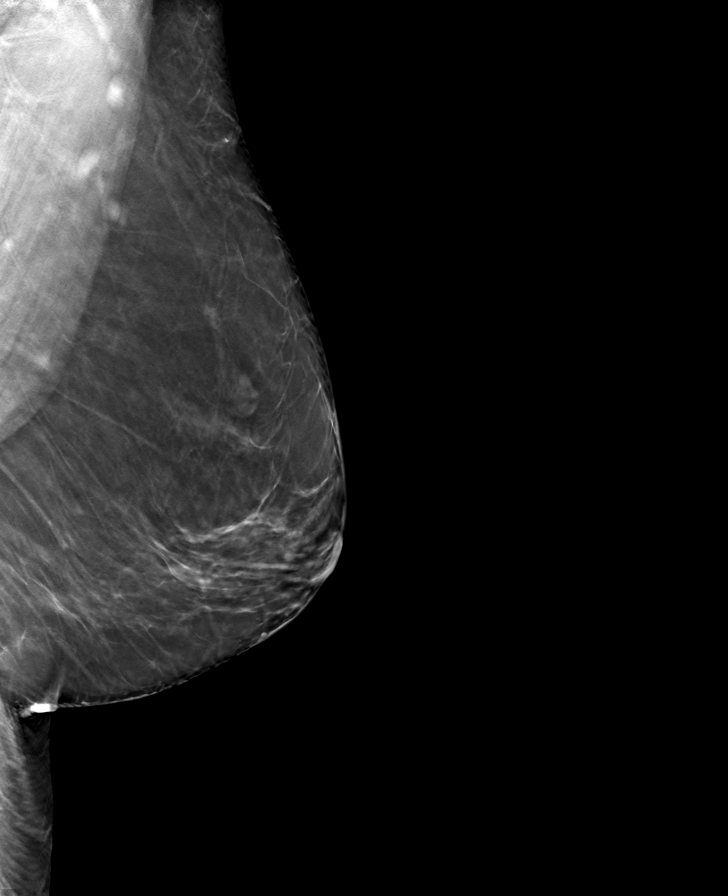

[8 of 24 positions shown; findings below may reference images not displayed]

ACR Breast Density Category b: There are scattered areas of
fibroglandular density.
FINDINGS: There are no findings suspicious for malignancy.
IMPRESSION: No mammographic evidence of malignancy. A result letter of this
screening mammogram will be mailed directly to the patient.

RECOMMENDATION:
Screening mammogram in one year. (Code:51-O-LD2)

BI-RADS CATEGORY  1: Negative.

## 2022-05-04 DIAGNOSIS — J309 Allergic rhinitis, unspecified: Secondary | ICD-10-CM | POA: Diagnosis not present

## 2022-05-04 DIAGNOSIS — J449 Chronic obstructive pulmonary disease, unspecified: Secondary | ICD-10-CM | POA: Diagnosis not present

## 2022-05-04 DIAGNOSIS — I10 Essential (primary) hypertension: Secondary | ICD-10-CM | POA: Diagnosis not present

## 2022-05-04 DIAGNOSIS — M85859 Other specified disorders of bone density and structure, unspecified thigh: Secondary | ICD-10-CM | POA: Diagnosis not present

## 2022-05-04 DIAGNOSIS — Z0000 Encounter for general adult medical examination without abnormal findings: Secondary | ICD-10-CM | POA: Diagnosis not present

## 2022-05-04 DIAGNOSIS — I129 Hypertensive chronic kidney disease with stage 1 through stage 4 chronic kidney disease, or unspecified chronic kidney disease: Secondary | ICD-10-CM | POA: Diagnosis not present

## 2022-05-04 DIAGNOSIS — F3341 Major depressive disorder, recurrent, in partial remission: Secondary | ICD-10-CM | POA: Diagnosis not present

## 2022-05-04 DIAGNOSIS — N1831 Chronic kidney disease, stage 3a: Secondary | ICD-10-CM | POA: Diagnosis not present

## 2022-06-26 ENCOUNTER — Other Ambulatory Visit: Payer: Self-pay | Admitting: Family Medicine

## 2022-06-26 DIAGNOSIS — Z1231 Encounter for screening mammogram for malignant neoplasm of breast: Secondary | ICD-10-CM

## 2022-08-09 ENCOUNTER — Ambulatory Visit
Admission: RE | Admit: 2022-08-09 | Discharge: 2022-08-09 | Discharge status: Home / Self Care | Payer: Medicare PPO | Source: Ambulatory Visit | Attending: Family Medicine | Admitting: Family Medicine

## 2022-08-09 DIAGNOSIS — Z1231 Encounter for screening mammogram for malignant neoplasm of breast: Secondary | ICD-10-CM

## 2022-08-18 DIAGNOSIS — R21 Rash and other nonspecific skin eruption: Secondary | ICD-10-CM | POA: Diagnosis not present

## 2022-11-07 DIAGNOSIS — N1831 Chronic kidney disease, stage 3a: Secondary | ICD-10-CM | POA: Diagnosis not present

## 2022-11-07 DIAGNOSIS — J449 Chronic obstructive pulmonary disease, unspecified: Secondary | ICD-10-CM | POA: Diagnosis not present

## 2022-11-07 DIAGNOSIS — I129 Hypertensive chronic kidney disease with stage 1 through stage 4 chronic kidney disease, or unspecified chronic kidney disease: Secondary | ICD-10-CM | POA: Diagnosis not present

## 2022-11-07 DIAGNOSIS — E7800 Pure hypercholesterolemia, unspecified: Secondary | ICD-10-CM | POA: Diagnosis not present

## 2022-11-07 DIAGNOSIS — A6000 Herpesviral infection of urogenital system, unspecified: Secondary | ICD-10-CM | POA: Diagnosis not present

## 2022-11-07 DIAGNOSIS — F3341 Major depressive disorder, recurrent, in partial remission: Secondary | ICD-10-CM | POA: Diagnosis not present

## 2023-11-05 DIAGNOSIS — F3341 Major depressive disorder, recurrent, in partial remission: Secondary | ICD-10-CM | POA: Diagnosis not present

## 2023-11-05 DIAGNOSIS — N1831 Chronic kidney disease, stage 3a: Secondary | ICD-10-CM | POA: Diagnosis not present

## 2023-11-05 DIAGNOSIS — I129 Hypertensive chronic kidney disease with stage 1 through stage 4 chronic kidney disease, or unspecified chronic kidney disease: Secondary | ICD-10-CM | POA: Diagnosis not present

## 2023-11-05 DIAGNOSIS — J449 Chronic obstructive pulmonary disease, unspecified: Secondary | ICD-10-CM | POA: Diagnosis not present

## 2023-12-17 ENCOUNTER — Ambulatory Visit: Admission: EM | Admit: 2023-12-17 | Discharge: 2023-12-17 | Disposition: A | Discharge status: Home / Self Care

## 2023-12-17 ENCOUNTER — Encounter: Payer: Self-pay | Admitting: Emergency Medicine

## 2023-12-17 DIAGNOSIS — H1012 Acute atopic conjunctivitis, left eye: Secondary | ICD-10-CM | POA: Diagnosis not present

## 2023-12-17 MED ORDER — ERYTHROMYCIN 5 MG/GM OP OINT: TOPICAL_OINTMENT | OPHTHALMIC | 0 refills | Status: AC

## 2023-12-17 MED ORDER — AZELASTINE HCL 0.05 % OP SOLN: 1.0000 [drp] | Freq: Two times a day (BID) | OPHTHALMIC | 1 refills | Status: AC

## 2023-12-17 NOTE — Discharge Instructions (Addendum)
  1. Allergic conjunctivitis of left eye (Primary) - azelastine  (OPTIVAR ) 0.05 % ophthalmic solution; Place 1 drop into both eyes 2 (two) times daily.  Dispense: 6 mL; Refill: 1 - erythromycin  ophthalmic ointment; Place a 1/2 inch ribbon of ointment into the lower eyelid.  Dispense: 3.5 g; Refill: 0 - Follow-up with eye doctor for further evaluation and management if symptoms do not improve or if symptoms become worse.

## 2023-12-17 NOTE — ED Provider Notes (Signed)
 [facility]  Note:  This document was prepared using Dragon voice recognition software and may include unintentional dictation errors.  MRN: [id number] DOB: [birth year]  Subjective:   Nichole Hanson Job is a 68 y.o. female presenting for nasal congestion, left eye redness, watery drainage, eye irritation x 2 to 3 days.  Patient denies any known injury or irritation to the eye.  Patient states that water has been consistently draining of watery discharge since Saturday.  Patient denies any known sick contacts or any exposure to conjunctivitis.  Patient denies any vision changes or pain with eye movement.  Patient denies using any over-the-counter eyedrops or medication to treat symptoms  No current facility-administered medications for this encounter.  Current Outpatient Medications:    azelastine  (OPTIVAR ) 0.05 % ophthalmic solution, Place 1 drop into both eyes 2 (two) times daily., Disp: 6 mL, Rfl: 1   erythromycin  ophthalmic ointment, Place a 1/2 inch ribbon of ointment into the lower eyelid., Disp: 3.5 g, Rfl: 0   albuterol  (VENTOLIN  HFA) 108 (90 Base) MCG/ACT inhaler, Inhale 2 puffs into the lungs every 6 (six) hours as needed for wheezing or shortness of breath., Disp: 6.7 g, Rfl: 0   buPROPion  (WELLBUTRIN  XL) 150 MG 24 hr tablet, Take 150 mg by mouth daily., Disp: , Rfl:    cetirizine (ZYRTEC) 10 MG tablet, Take 10 mg by mouth daily., Disp: , Rfl:    docusate sodium (COLACE) 100 MG capsule, Take 300 mg by mouth at bedtime., Disp: , Rfl:    hydrochlorothiazide  (HYDRODIURIL ) 12.5 MG tablet, Take 12.5 mg by mouth daily., Disp: , Rfl:    melatonin 3 MG TABS tablet, Take 3 mg by mouth at bedtime. Gummy, 1-2 at a time, Disp: , Rfl:    Nutritional Supplements (ESTROVEN PO), Take 1 tablet by mouth at bedtime., Disp: , Rfl:    OVER THE COUNTER MEDICATION, ION Gut support Take 1 teaspoon daily in the AM, Disp: , Rfl:    OVER THE COUNTER MEDICATION, Fiberwise fiber drink supplement   One daily, Disp: , Rfl:    OVER THE COUNTER MEDICATION, Boost drink  One daily, Disp: , Rfl:    OVER THE COUNTER MEDICATION, cardioOmega  2 capsules once a day, Disp: , Rfl:    OVER THE COUNTER MEDICATION, Phytommega 2 capsules once a day, Disp: , Rfl:    OVER THE COUNTER MEDICATION, Replenex advance  One tablet daily, Disp: , Rfl:    predniSONE  (STERAPRED UNI-PAK 21 TAB) 10 MG (21) TBPK tablet, Take by mouth daily. Take 6 tabs by mouth daily  for 2 days, then 5 tabs for 2 days, then 4 tabs for 2 days, then 3 tabs for 2 days, 2 tabs for 2 days, then 1 tab by mouth daily for 2 days, Disp: 42 tablet, Rfl: 0   St Johns Wort 300 MG TABS, Take by mouth. One twice a day, Disp: , Rfl:    VITAMIN D PO, Take by mouth. 2000iu, one daily, Disp: , Rfl:    Allergies  Allergen Reactions   Asa Arthritis Strength-Antacid [Aspirin Buffered] Other (See Comments)    Tingling on one side body-PATIENT DENIES THIS IS AN ALLERGY    Past Medical History:  Diagnosis Date   Allergic rhinitis, cause unspecified 07/24/2011   Arthritis    Asthma      Past Surgical History:  Procedure Laterality Date   ABDOMINAL HYSTERECTOMY     APPENDECTOMY     CESAREAN SECTION  CHOLECYSTECTOMY      Family History  Problem Relation Age of Onset   Breast cancer Mother    Arthritis Mother    Colon cancer Father        dx in his 68's   Diabetes Sister    Diabetes Sister    Diabetes Paternal Aunt    Diabetes Paternal Aunt    Heart disease Neg Hx    Hyperlipidemia Neg Hx    Hypertension Neg Hx    Kidney disease Neg Hx    Stroke Neg Hx     Social History   Tobacco Use   Smoking status: Former    Current packs/day: 0.80    Average packs/day: 0.8 packs/day for 25.0 years (20.0 ttl pk-yrs)    Types: Cigarettes   Smokeless tobacco: Never  Vaping Use   Vaping status: Every Day  Substance Use Topics   Alcohol use: No   Drug use: No    ROS Refer to HPI for ROS details.  Objective:    Vitals: BP (!)  163/92 (BP Location: Right Arm)   Pulse 83   Temp 98.3 F (36.8 C) (Oral)   Resp 17   SpO2 93%   Physical Exam Vitals and nursing note reviewed.  Constitutional:      General: She is not in acute distress.    Appearance: Normal appearance. She is not ill-appearing.  HENT:     Head: Normocephalic.  Eyes:     General: Lids are normal. Vision grossly intact. Gaze aligned appropriately. Allergic shiner present. No visual field deficit.       Right eye: No foreign body, discharge or hordeolum.        Left eye: Discharge present.No foreign body or hordeolum.     Extraocular Movements:     Right eye: Normal extraocular motion.     Left eye: Normal extraocular motion.     Conjunctiva/sclera:     Right eye: Right conjunctiva is not injected. No exudate.    Left eye: Left conjunctiva is injected. No exudate. Cardiovascular:     Rate and Rhythm: Normal rate.  Pulmonary:     Effort: Pulmonary effort is normal. No respiratory distress.  Skin:    General: Skin is warm and dry.     Capillary Refill: Capillary refill takes less than 2 seconds.  Neurological:     General: No focal deficit present.     Mental Status: She is alert and oriented to person, place, and time.  Psychiatric:        Mood and Affect: Mood normal.        Behavior: Behavior normal.     Procedures  No results found for this or any previous visit (from the past 24 hours).  Assessment and Plan :     Discharge Instructions       1. Allergic conjunctivitis of left eye (Primary) - azelastine  (OPTIVAR ) 0.05 % ophthalmic solution; Place 1 drop into both eyes 2 (two) times daily.  Dispense: 6 mL; Refill: 1 - erythromycin  ophthalmic ointment; Place a 1/2 inch ribbon of ointment into the lower eyelid.  Dispense: 3.5 g; Refill: 0 - Follow-up with eye doctor for further evaluation and management if symptoms do not improve or if symptoms become worse.      Lancer Thurner B Chayton Murata   Dearia [place], [place], [place] 12/17/23  1650

## 2023-12-17 NOTE — ED Triage Notes (Signed)
 Pt c/o left eye pain swelling redness and drainage since Saturday.  Denies any recent injury to eye

## 2023-12-18 ENCOUNTER — Emergency Department (HOSPITAL_BASED_OUTPATIENT_CLINIC_OR_DEPARTMENT_OTHER)
Admission: EM | Admit: 2023-12-18 | Discharge: 2023-12-18 | Disposition: A | Discharge status: Home / Self Care | Attending: Emergency Medicine | Admitting: Emergency Medicine

## 2023-12-18 ENCOUNTER — Other Ambulatory Visit: Payer: Self-pay

## 2023-12-18 ENCOUNTER — Encounter (HOSPITAL_BASED_OUTPATIENT_CLINIC_OR_DEPARTMENT_OTHER): Payer: Self-pay

## 2023-12-18 DIAGNOSIS — Z79899 Other long term (current) drug therapy: Secondary | ICD-10-CM | POA: Insufficient documentation

## 2023-12-18 DIAGNOSIS — H00015 Hordeolum externum left lower eyelid: Secondary | ICD-10-CM

## 2023-12-18 DIAGNOSIS — L03213 Periorbital cellulitis: Secondary | ICD-10-CM

## 2023-12-18 DIAGNOSIS — I129 Hypertensive chronic kidney disease with stage 1 through stage 4 chronic kidney disease, or unspecified chronic kidney disease: Secondary | ICD-10-CM | POA: Diagnosis not present

## 2023-12-18 DIAGNOSIS — I10 Essential (primary) hypertension: Secondary | ICD-10-CM

## 2023-12-18 DIAGNOSIS — N189 Chronic kidney disease, unspecified: Secondary | ICD-10-CM | POA: Diagnosis not present

## 2023-12-18 DIAGNOSIS — H5712 Ocular pain, left eye: Secondary | ICD-10-CM | POA: Diagnosis present

## 2023-12-18 HISTORY — DX: Essential (primary) hypertension: I10

## 2023-12-18 MED ORDER — AMOXICILLIN-POT CLAVULANATE 875-125 MG PO TABS: 1.0000 | ORAL_TABLET | Freq: Two times a day (BID) | ORAL | 0 refills | Status: DC

## 2023-12-18 MED ORDER — HYDROCHLOROTHIAZIDE 25 MG PO TABS
25.0000 mg | ORAL_TABLET | Freq: Once | ORAL | Status: AC
  Administered 2023-12-18 (×2): 25 mg via ORAL
  Filled 2023-12-18: qty 1

## 2023-12-18 MED ORDER — TETRACAINE HCL 0.5 % OP SOLN
1.0000 [drp] | Freq: Once | OPHTHALMIC | Status: AC
  Administered 2023-12-18 (×2): 1 [drp] via OPHTHALMIC
  Filled 2023-12-18: qty 4

## 2023-12-18 MED ORDER — HYDROCHLOROTHIAZIDE 25 MG PO TABS: 25.0000 mg | ORAL_TABLET | Freq: Every day | ORAL | 0 refills | Status: AC

## 2023-12-18 NOTE — ED Provider Notes (Signed)
 [place] [facility] Provider Note   CSN: [id number] Arrival date & time: 12/18/23  2121     Patient presents with: Facial Pain   Nichole Hanson is a 68 y.o. female who presents emergency department chief complaint of left eye pain. she has a past medical history of chronic kidney disease, hypertension, hypercholesterolemia.  She reports that in the past she was told that she had some increased pressure in my eyes.  She took medication for that in the past but is not on any at this time.  She was seen at an urgent care yesterday and diagnosed with allergic conjunctivitis.  She was placed on azelastine  drops and erythromycin  ointment.  She has been using the medication without relief and reports worsening pain in the inferior lid.  She is also noticed some discharge and mattering of the lower eyelid which is new.  She states that she does not have a lot of eye pain and when she touches the bottom lid.  She has some intermittent blurry vision which she states is relieved with blinking and thinks is due to the discharge in her eye.   HPI     Prior to Admission medications   Medication Sig Start Date End Date Taking? Authorizing Provider  albuterol  (VENTOLIN  HFA) 108 (90 Base) MCG/ACT inhaler Inhale 2 puffs into the lungs every 6 (six) hours as needed for wheezing or shortness of breath. 12/04/18   Dennise Lavada POUR, MD  azelastine  (OPTIVAR ) 0.05 % ophthalmic solution Place 1 drop into both eyes 2 (two) times daily. 12/17/23   Reddick, Johnathan B, NP  buPROPion  (WELLBUTRIN  XL) 150 MG 24 hr tablet Take 150 mg by mouth daily.    [provider]  cetirizine (ZYRTEC) 10 MG tablet Take 10 mg by mouth daily. 06/06/21   [provider]  docusate sodium (COLACE) 100 MG capsule Take 300 mg by mouth at bedtime.    [provider]  erythromycin  ophthalmic ointment Place a 1/2 inch ribbon of ointment into the lower eyelid. 12/17/23   Reddick,  Johnathan B, NP  hydrochlorothiazide  (HYDRODIURIL ) 12.5 MG tablet Take 12.5 mg by mouth daily.    [provider]  melatonin 3 MG TABS tablet Take 3 mg by mouth at bedtime. Gummy, 1-2 at a time    [provider]  Nutritional Supplements (ESTROVEN PO) Take 1 tablet by mouth at bedtime.    [provider]  OVER THE COUNTER MEDICATION ION Gut support Take 1 teaspoon daily in the AM    [provider]  OVER THE COUNTER MEDICATION Fiberwise fiber drink supplement  One daily    [provider]  OVER THE COUNTER MEDICATION Boost drink  One daily    [provider]  OVER THE COUNTER MEDICATION cardioOmega  2 capsules once a day    [provider]  OVER THE COUNTER MEDICATION Phytommega 2 capsules once a day    [provider]  OVER THE COUNTER MEDICATION Replenex advance  One tablet daily    [provider]  predniSONE  (STERAPRED UNI-PAK 21 TAB) 10 MG (21) TBPK tablet Take by mouth daily. Take 6 tabs by mouth daily  for 2 days, then 5 tabs for 2 days, then 4 tabs for 2 days, then 3 tabs for 2 days, 2 tabs for 2 days, then 1 tab by mouth daily for 2 days 07/03/21   Wurst, [place], PA-C  St Johns Wort 300 MG TABS Take by  mouth. One twice a day    [provider]  VITAMIN D PO Take by mouth. 2000iu, one daily    [provider]    Allergies: Patient has no active allergies.    Review of Systems  Updated Vital Signs BP (!) 165/107 (BP Location: Left Arm)   Pulse 82   Temp 98.7 F (37.1 C) (Oral)   Resp 20   Ht 5' 4 (1.626 m)   Wt 63.5 kg   SpO2 99%   BMI 24.03 kg/m   Physical Exam Vitals and nursing note reviewed.  Constitutional:      General: She is not in acute distress.    Appearance: She is well-developed. She is not diaphoretic.  HENT:     Head: Normocephalic and atraumatic.     Right Ear: External ear normal.     Left Ear: External ear normal.     Nose: Nose normal.      Mouth/Throat:     Mouth: Mucous membranes are moist.  Eyes:     General: Vision grossly intact. Gaze aligned appropriately. No scleral icterus.    Intraocular pressure: Right eye pressure is 21 mmHg. Left eye pressure is 23 mmHg.     Extraocular Movements: Extraocular movements intact.     Conjunctiva/sclera:     Left eye: Left conjunctiva is injected. Exudate present. No chemosis or hemorrhage.    Comments: Left inferior lid with either hordeolum or meibomian gland infection.  There is surrounding erythema and swelling around the inferior orbit suggestive of preseptal cellulitis, no pain with movement of the eye.  Cardiovascular:     Rate and Rhythm: Normal rate and regular rhythm.     Heart sounds: Normal heart sounds. No murmur heard.    No friction rub. No gallop.  Pulmonary:     Effort: Pulmonary effort is normal. No respiratory distress.     Breath sounds: Normal breath sounds.  Abdominal:     General: Bowel sounds are normal. There is no distension.     Palpations: Abdomen is soft. There is no mass.     Tenderness: There is no abdominal tenderness. There is no guarding.  Musculoskeletal:     Cervical back: Normal range of motion.  Skin:    General: Skin is warm and dry.  Neurological:     Mental Status: She is alert and oriented to person, place, and time.  Psychiatric:        Behavior: Behavior normal.     (all labs ordered are listed, but only abnormal results are displayed) Labs Reviewed - No data to display  EKG: None  Radiology: No results found.   Procedures   Medications Ordered in the ED - No data to display                                  Medical Decision Making Risk Prescription drug management.   Patient here for reevaluation of pain in the left eye.  She was seen yesterday and diagnosed with allergic conjunctivitis. She reports a history of elevated eye pressures however patient's physical examination is not consistent with acute angle-closure  glaucoma and patient's eye pressures are not significantly elevated. On exam patient's left eye is mildly injected without chemosis and her vision is grossly intact symptoms are not significantly suggestive of conjunctivitis of any type.  She does have a large stye on the lower left lid there is a  purulent head visible on the mucosal side of the lid that is not open.  There is some mild purulent discharge from the meibomian glands. Patient now has a significant amount of swelling and erythema extending from the inferior lid margins circumferentially to about the level of the zygoma.  It is tender and erythematous and is now suggestive of some developing preseptal cellulitis.  She does not have any evidence of orbital cellulitis.  Will have the patient continued on her prescribed medications from the urgent care and will begin an oral antibiotic.  I have given the patient referral to on-call ophthalmology for further management and assessment and reevaluation of symptoms and expressed reasons to seek immediate medical care in the emergency department.  Patient is comfortable with this plan.  She is given a work note for tomorrow and strict return precautions.     Final diagnoses:  None    ED Discharge Orders     None          Arloa Chroman, PA-C 12/19/23 1452    Elnor Jayson LABOR, DO 12/19/23 1616

## 2023-12-18 NOTE — Discharge Instructions (Addendum)
 How is this treated? Most styes will clear up in a few days without treatment or with warm compresses applied to the area. You may need to use antibiotic drops or ointment to treat an infection. Sometimes, steroid drops or ointment are used in addition to antibiotics. In some cases, your health care provider may give you a small steroid injection in the eyelid. If your stye does not heal with routine treatment, your health care provider may drain pus from the stye using a thin blade or needle. This may be done if the stye is large, causing a lot of pain, or affecting your vision.   Get help right away if: You have new symptoms, or your symptoms get worse. Your symptoms don't get better with treatment. You have a fever. Your eyesight gets blurry or gets worse in any way. You start to see double of everything. Your eye looks like it's sticking out or bulging out. You have trouble moving your eyes or pain when you move your eyes. You have a very bad headache. You have very bad neck pain, or your neck gets stiff. These symptoms may be an emergency. Call 911 right away. Do not wait to see if the symptoms will go away. Do not drive yourself to the hospital.

## 2023-12-18 NOTE — ED Triage Notes (Addendum)
 Pt describes facial pain under left eye. Was seen at [facility] yesterday and prescribed 2 meds e-mycin  ointment and Azelastine  HCL drops. Has used with no relief.   Dx with conjunctivitis

## 2024-04-10 ENCOUNTER — Ambulatory Visit: Payer: Self-pay

## 2024-04-10 NOTE — Telephone Encounter (Signed)
 FYI Only or Action Required?: FYI only for provider: appointment scheduled on at urgent care for 04/11/2024.  Patient was last seen in primary care on 07/03/2021 by Martell Grate, PA-C.  Called Nurse Triage reporting Urinary Frequency.  Symptoms began for months and worsening.  Interventions attempted: Nothing.  Symptoms are: gradually worsening.  Triage Disposition: See Physician Within 24 Hours  Patient/caregiver understands and will follow disposition?: Yes   Copied from CRM #[id number]. Topic: Clinical - Red Word Triage >> Apr 10, 2024  5:01 PM Nessti S wrote: [facility] that prompted transfer to Nurse Triage: getting cramps and thinks she has uti because she has been urinating freq with cloudiness Reason for Disposition  Urinating more frequently than usual (i.e., frequency) OR new-onset of the feeling of an urgent need to urinate (i.e., urgency)  Answer Assessment - Initial Assessment Questions 1. SYMPTOM: What's the main symptom you're concerned about? (e.g., frequency, incontinence)     Urinary frequency 2. ONSET: When did the    start?     Cramping for months 3. PAIN: Is there any pain? If Yes, ask: How bad is it? (Scale: 1-10; mild, moderate, severe)     Mild to moderate 4. CAUSE: What do you think is causing the symptoms?     UTI 5. OTHER SYMPTOMS: Do you have any other symptoms? (e.g., blood in urine, fever, flank pain, pain with urination)     Tired, cramping in left, cloudy urine  6. PREGNANCY: Is there any chance you are pregnant? When was your last menstrual period?     na  Protocols used: Urinary Symptoms-A-AH

## 2024-04-11 ENCOUNTER — Inpatient Hospital Stay: Admission: RE | Admit: 2024-04-11 | Discharge: 2024-04-11 | Payer: Self-pay | Attending: Family Medicine

## 2024-04-11 VITALS — BP 156/93 | HR 89 | Temp 97.6°F | Resp 18

## 2024-04-11 DIAGNOSIS — N3001 Acute cystitis with hematuria: Secondary | ICD-10-CM

## 2024-04-11 DIAGNOSIS — R8290 Unspecified abnormal findings in urine: Secondary | ICD-10-CM | POA: Diagnosis not present

## 2024-04-11 LAB — POCT URINE DIPSTICK
Bilirubin, UA: NEGATIVE
Glucose, UA: NEGATIVE mg/dL
Ketones, POC UA: NEGATIVE mg/dL
Nitrite, UA: NEGATIVE
Protein Ur, POC: 30 mg/dL — AB
Spec Grav, UA: <=1.005 — AB
Urobilinogen, UA: 0.2 U/dL
pH, UA: 6

## 2024-04-11 MED ORDER — CEPHALEXIN 500 MG PO CAPS: 500.0000 mg | ORAL_CAPSULE | Freq: Two times a day (BID) | ORAL | 0 refills | Status: DC

## 2024-04-11 NOTE — Discharge Instructions (Addendum)
 Please start cephalexin  to address an urinary tract infection. Make sure you hydrate very well with plain water and a quantity of 64 ounces of water a day.  Please limit drinks that are considered urinary irritants such as fruit juices, soda, sweet tea, coffee, artifical sweetened drinks, energy drinks, alcohol.  These can worsen your urinary and genital symptoms but also be the source of them.  I will let you know about your urine culture results through MyChart to see if we need to prescribe or change your antibiotics based off of those results.

## 2024-04-11 NOTE — ED Provider Notes (Signed)
 [facility]  Note:  This document was prepared using Conservation officer, historic buildings and may include unintentional dictation errors.  MRN: [id number] DOB: 12/14/1955  Subjective:   Nichole Hanson is a 68 y.o. female presenting for 2-day history of urinary frequency and urgency, cloudy urine had cramps in her right leg and arm yesterday.  This is resolved today.  Does not drink as much water as she should.  Reports that she does drink sodas and fruit juices daily.  Denies fever, n/v, abdominal pain, pelvic pain, rashes, dysuria, hematuria, vaginal discharge.  No fall, trauma, numbness or tingling, saddle paresthesia, changes to bowel or urinary habits, radicular symptoms.   No current facility-administered medications for this encounter.  Current Outpatient Medications:    albuterol  (VENTOLIN  HFA) 108 (90 Base) MCG/ACT inhaler, Inhale 2 puffs into the lungs every 6 (six) hours as needed for wheezing or shortness of breath., Disp: 6.7 g, Rfl: 0   amoxicillin -clavulanate (AUGMENTIN ) 875-125 MG tablet, Take 1 tablet by mouth every 12 (twelve) hours., Disp: 14 tablet, Rfl: 0   azelastine  (OPTIVAR ) 0.05 % ophthalmic solution, Place 1 drop into both eyes 2 (two) times daily., Disp: 6 mL, Rfl: 1   buPROPion  (WELLBUTRIN  XL) 150 MG 24 hr tablet, Take 150 mg by mouth daily., Disp: , Rfl:    cetirizine (ZYRTEC) 10 MG tablet, Take 10 mg by mouth daily., Disp: , Rfl:    docusate sodium (COLACE) 100 MG capsule, Take 300 mg by mouth at bedtime., Disp: , Rfl:    erythromycin  ophthalmic ointment, Place a 1/2 inch ribbon of ointment into the lower eyelid., Disp: 3.5 g, Rfl: 0   hydrochlorothiazide  (HYDRODIURIL ) 12.5 MG tablet, Take 12.5 mg by mouth daily., Disp: , Rfl:    hydrochlorothiazide  (HYDRODIURIL ) 25 MG tablet, Take 1 tablet (25 mg total) by mouth daily for 7 days., Disp: 7 tablet, Rfl: 0   melatonin 3 MG TABS tablet, Take 3 mg by mouth at bedtime. Gummy, 1-2 at a time, Disp: ,  Rfl:    Nutritional Supplements (ESTROVEN PO), Take 1 tablet by mouth at bedtime., Disp: , Rfl:    OVER THE COUNTER MEDICATION, ION Gut support Take 1 teaspoon daily in the AM, Disp: , Rfl:    OVER THE COUNTER MEDICATION, Fiberwise fiber drink supplement  One daily, Disp: , Rfl:    OVER THE COUNTER MEDICATION, Boost drink  One daily, Disp: , Rfl:    OVER THE COUNTER MEDICATION, cardioOmega  2 capsules once a day, Disp: , Rfl:    OVER THE COUNTER MEDICATION, Phytommega 2 capsules once a day, Disp: , Rfl:    OVER THE COUNTER MEDICATION, Replenex advance  One tablet daily, Disp: , Rfl:    predniSONE  (STERAPRED UNI-PAK 21 TAB) 10 MG (21) TBPK tablet, Take by mouth daily. Take 6 tabs by mouth daily  for 2 days, then 5 tabs for 2 days, then 4 tabs for 2 days, then 3 tabs for 2 days, 2 tabs for 2 days, then 1 tab by mouth daily for 2 days, Disp: 42 tablet, Rfl: 0   St Johns Wort 300 MG TABS, Take by mouth. One twice a day, Disp: , Rfl:    VITAMIN D PO, Take by mouth. 2000iu, one daily, Disp: , Rfl:    Allergies  Allergen Reactions   Aspirin Other (See Comments)    Tingling on one side body-PATIENT DENIES THIS IS AN ALLERGY   Garlic     States she can  eat it, but not touch it.     Past Medical History:  Diagnosis Date   Allergic rhinitis, cause unspecified 07/24/2011   Arthritis    Asthma    Hypertension      Past Surgical History:  Procedure Laterality Date   ABDOMINAL HYSTERECTOMY     APPENDECTOMY     CESAREAN SECTION     CHOLECYSTECTOMY      Family History  Problem Relation Age of Onset   Breast cancer Mother    Arthritis Mother    Colon cancer Father        dx in his 39's   Diabetes Sister    Diabetes Sister    Diabetes Paternal Aunt    Diabetes Paternal Aunt    Heart disease Neg Hx    Hyperlipidemia Neg Hx    Hypertension Neg Hx    Kidney disease Neg Hx    Stroke Neg Hx     Social History   Tobacco Use   Smoking status: Former    Current packs/day: 0.80     Average packs/day: 0.8 packs/day for 25.0 years (20.0 ttl pk-yrs)    Types: Cigarettes   Smokeless tobacco: Never  Vaping Use   Vaping status: Every Day  Substance Use Topics   Alcohol use: No   Drug use: Never    ROS   Objective:   Vitals: BP (!) 156/93 (BP Location: Left Arm)   Pulse 89   Temp 97.6 F (36.4 C) (Oral)   Resp 18   SpO2 94%   Physical Exam Constitutional:      General: She is not in acute distress.    Appearance: Normal appearance. She is well-developed. She is not ill-appearing, toxic-appearing or diaphoretic.  HENT:     Head: Normocephalic and atraumatic.     Nose: Nose normal.     Mouth/Throat:     Mouth: Mucous membranes are moist.  Eyes:     General: No scleral icterus.       Right eye: No discharge.        Left eye: No discharge.     Extraocular Movements: Extraocular movements intact.     Conjunctiva/sclera: Conjunctivae normal.  Cardiovascular:     Rate and Rhythm: Normal rate.  Pulmonary:     Effort: Pulmonary effort is normal.  Abdominal:     General: Bowel sounds are normal. There is no distension.     Palpations: Abdomen is soft. There is no mass.     Tenderness: There is no abdominal tenderness. There is no right CVA tenderness, left CVA tenderness, guarding or rebound.  Musculoskeletal:     Lumbar back: No swelling, edema, deformity, signs of trauma, lacerations, spasms, tenderness or bony tenderness. Normal range of motion. Negative right straight leg raise test and negative left straight leg raise test. No scoliosis.  Skin:    General: Skin is warm and dry.  Neurological:     General: No focal deficit present.     Mental Status: She is alert and oriented to person, place, and time.  Psychiatric:        Mood and Affect: Mood normal.        Behavior: Behavior normal.        Thought Content: Thought content normal.        Judgment: Judgment normal.     Results for orders placed or performed during the hospital encounter of  04/11/24 (from the past 24 hours)  POCT URINE DIPSTICK  Status: Abnormal   Collection Time: 04/11/24  5:28 PM  Result Value Ref Range   Color, UA light yellow (A) yellow   Clarity, UA cloudy (A) clear   Glucose, UA negative negative mg/dL   Bilirubin, UA negative negative   Ketones, POC UA negative negative mg/dL   Spec Grav, UA <=8.994 (A) 1.010 - 1.025   Blood, UA large (A) negative   pH, UA 6.0 5.0 - 8.0   Protein Ur, POC =30 (A) negative mg/dL   Urobilinogen, UA 0.2 0.2 or 1.0 E.U./dL   Nitrite, UA Negative Negative   Leukocytes, UA Moderate (2+) (A) Negative     Assessment and Plan :   PDMP not reviewed this encounter.  1. Acute cystitis with hematuria   2. Cloudy urine    Start cephalexin  to cover for acute cystitis, urine culture pending.  Recommended consistent hydration, limiting urinary irritants. Counseled patient on potential for adverse effects with medications prescribed/recommended today, ER and return-to-clinic precautions discussed, patient verbalized understanding.     Christopher Savannah, [place] 04/11/24 1734

## 2024-04-11 NOTE — ED Triage Notes (Signed)
 Pt reports cramps in the right leg and right arm  she was standing up at work when this started yesterday; cloudy urine x 2 days.

## 2024-04-14 ENCOUNTER — Ambulatory Visit (HOSPITAL_COMMUNITY): Payer: Self-pay

## 2024-04-14 LAB — URINE CULTURE: Culture: >=100000 — AB

## 2024-05-27 ENCOUNTER — Telehealth: Admitting: Emergency Medicine

## 2024-05-27 DIAGNOSIS — R300 Dysuria: Secondary | ICD-10-CM

## 2024-05-27 NOTE — Progress Notes (Signed)
" °  Because of your age and also because you recently were treated for urinary tract infection, you will need urine testing which I cannot do by evisit and I recommend that you be seen in a face-to-face visit. I am sorry I cannot help you by evisit or virtual care.    NOTE: There will be NO CHARGE for this E-Visit   If you are having a true medical emergency, please call 911.     For an urgent face to face visit, [facility] has multiple urgent care centers for your convenience.  Click the link below for the full list of locations and hours, walk-in wait times, appointment scheduling options and driving directions:  Urgent Care - [place], [place], [place], [place], [place], [place]  [place]     Your MyChart E-visit questionnaire answers were reviewed by a board certified advanced clinical practitioner to complete your personal care plan based on your specific symptoms.    Thank you for using e-Visits.    "

## 2024-06-03 ENCOUNTER — Ambulatory Visit: Payer: Self-pay

## 2024-06-03 NOTE — Telephone Encounter (Signed)
 FYI Only or Action Required?: FYI only for provider: [facility] or [facility] advised.  Patient was last seen in primary care on 07/03/2021 by Martell Grate, PA-C.  Called Nurse Triage reporting Urine Output.  Symptoms began a week ago.  Interventions attempted: Rest, hydration, or home remedies.  Symptoms are: unchanged.  Triage Disposition: See Physician Within 24 Hours  Patient/caregiver understands and will follow disposition?: Yes  Reason for Disposition  Bad or foul-smelling urine  Answer Assessment - Initial Assessment Questions Patient states that she was seen on 04/11/24 at [facility] for a UTI. She took antibiotic and it cleared up, but she started to experience cloudy urine last Monday and is concerned she may have another UTI. She reports odorous urine as well, but denies any other symptoms. Office visit advised, but patient is establishing care with [facility] next week on 06/11/24. Advised to go to [facility] or [facility] tomorrow for care-provided with information for both locations.   1. SYMPTOM: What's the main symptom you're concerned about? (e.g., frequency, incontinence)     Cloudy urine  2. ONSET: When did the  symptoms  start?     About 1 week ago  3. PAIN: Is there any pain? If Yes, ask: How bad is it? (Scale: 1-10; mild, moderate, severe)     No   4. CAUSE: What do you think is causing the symptoms?     UTI  5. OTHER SYMPTOMS: Do you have any other symptoms? (e.g., blood in urine, fever, flank pain, pain with urination)     Denies any other symptoms  6. PREGNANCY: Is there any chance you are pregnant? When was your last menstrual period?     NA  Protocols used: Urinary Symptoms-A-AH  Copied from CRM #[id number]. Topic: Clinical - Medical Advice >> Jun 03, 2024  5:29 PM Terri G wrote: Reason for CRM: Patient believes her UTI came back BUT her only symptom is cloudy urine and she is not established yet her appt isnt til 2/4 please advised.  Callback number

## 2024-06-10 DIAGNOSIS — M419 Scoliosis, unspecified: Secondary | ICD-10-CM | POA: Insufficient documentation

## 2024-06-10 DIAGNOSIS — M1380 Other specified arthritis, unspecified site: Secondary | ICD-10-CM | POA: Insufficient documentation

## 2024-06-11 ENCOUNTER — Ambulatory Visit: Admitting: Family Medicine

## 2024-06-11 ENCOUNTER — Telehealth: Payer: Self-pay | Admitting: Neurology

## 2024-06-11 DIAGNOSIS — I10 Essential (primary) hypertension: Secondary | ICD-10-CM

## 2024-06-11 DIAGNOSIS — Z0000 Encounter for general adult medical examination without abnormal findings: Secondary | ICD-10-CM

## 2024-06-11 NOTE — Telephone Encounter (Signed)
 Copied from CRM #[id number]. Topic: Appointments - Appointment Scheduling >> Jun 11, 2024 10:41 AM Roselie BROCKS wrote: Patient/patient representative is calling to schedule an appointment. Refer to attachments for appointment information.  Patient rescheduled a TOC appnt from Dr Almarie on 06-11-24, to Dr. Levora. 07-16-24

## 2024-07-16 ENCOUNTER — Ambulatory Visit: Admitting: Physician Assistant
